# Patient Record
Sex: Female | Born: 1950
Health system: Southern US, Community
[De-identification: ages and names within clinical notes are randomized; demographics above are authoritative.]

## PROBLEM LIST (undated history)

## (undated) ENCOUNTER — Emergency Department: Discharge status: Absent without leave | Payer: Self-pay

## (undated) DIAGNOSIS — E039 Hypothyroidism, unspecified: Secondary | ICD-10-CM

## (undated) DIAGNOSIS — Z85850 Personal history of malignant neoplasm of thyroid: Secondary | ICD-10-CM

## (undated) DIAGNOSIS — L409 Psoriasis, unspecified: Secondary | ICD-10-CM

## (undated) HISTORY — PX: TONSILLECTOMY: SUR1361

## (undated) HISTORY — PX: THYROIDECTOMY, PARTIAL: SHX18

## (undated) HISTORY — PX: APPENDECTOMY: SHX54

---

## 1998-11-09 ENCOUNTER — Other Ambulatory Visit: Admission: RE | Admit: 1998-11-09 | Discharge: 1998-11-09 | Payer: Self-pay | Admitting: Obstetrics & Gynecology

## 2002-10-15 ENCOUNTER — Other Ambulatory Visit: Admission: RE | Admit: 2002-10-15 | Discharge: 2002-10-15 | Payer: Self-pay | Admitting: Obstetrics & Gynecology

## 2003-10-16 ENCOUNTER — Other Ambulatory Visit: Admission: RE | Admit: 2003-10-16 | Discharge: 2003-10-16 | Payer: Self-pay | Admitting: Obstetrics & Gynecology

## 2004-10-20 ENCOUNTER — Other Ambulatory Visit: Admission: RE | Admit: 2004-10-20 | Discharge: 2004-10-20 | Payer: Self-pay | Admitting: Obstetrics & Gynecology

## 2005-10-17 ENCOUNTER — Other Ambulatory Visit: Admission: RE | Admit: 2005-10-17 | Discharge: 2005-10-17 | Payer: Self-pay | Admitting: Obstetrics & Gynecology

## 2010-11-11 ENCOUNTER — Encounter
Admission: RE | Admit: 2010-11-11 | Discharge: 2010-11-11 | Payer: Self-pay | Source: Home / Self Care | Attending: Obstetrics & Gynecology | Admitting: Obstetrics & Gynecology

## 2010-11-30 ENCOUNTER — Encounter
Admission: RE | Admit: 2010-11-30 | Discharge: 2010-11-30 | Payer: Self-pay | Source: Home / Self Care | Attending: Gastroenterology | Admitting: Gastroenterology

## 2016-02-05 DIAGNOSIS — E038 Other specified hypothyroidism: Secondary | ICD-10-CM | POA: Diagnosis not present

## 2016-02-05 DIAGNOSIS — Z0000 Encounter for general adult medical examination without abnormal findings: Secondary | ICD-10-CM | POA: Diagnosis not present

## 2016-02-12 DIAGNOSIS — E038 Other specified hypothyroidism: Secondary | ICD-10-CM | POA: Diagnosis not present

## 2016-02-12 DIAGNOSIS — Z6829 Body mass index (BMI) 29.0-29.9, adult: Secondary | ICD-10-CM | POA: Diagnosis not present

## 2016-02-12 DIAGNOSIS — Z780 Asymptomatic menopausal state: Secondary | ICD-10-CM | POA: Diagnosis not present

## 2016-02-12 DIAGNOSIS — E784 Other hyperlipidemia: Secondary | ICD-10-CM | POA: Diagnosis not present

## 2016-02-12 DIAGNOSIS — Z23 Encounter for immunization: Secondary | ICD-10-CM | POA: Diagnosis not present

## 2016-02-12 DIAGNOSIS — Z0000 Encounter for general adult medical examination without abnormal findings: Secondary | ICD-10-CM | POA: Diagnosis not present

## 2016-02-12 DIAGNOSIS — Z1389 Encounter for screening for other disorder: Secondary | ICD-10-CM | POA: Diagnosis not present

## 2016-02-12 DIAGNOSIS — G4739 Other sleep apnea: Secondary | ICD-10-CM | POA: Diagnosis not present

## 2016-02-12 DIAGNOSIS — N390 Urinary tract infection, site not specified: Secondary | ICD-10-CM | POA: Diagnosis not present

## 2016-02-12 DIAGNOSIS — G4700 Insomnia, unspecified: Secondary | ICD-10-CM | POA: Diagnosis not present

## 2016-02-12 DIAGNOSIS — L409 Psoriasis, unspecified: Secondary | ICD-10-CM | POA: Diagnosis not present

## 2016-02-12 DIAGNOSIS — R739 Hyperglycemia, unspecified: Secondary | ICD-10-CM | POA: Diagnosis not present

## 2017-10-24 DIAGNOSIS — Z5181 Encounter for therapeutic drug level monitoring: Secondary | ICD-10-CM | POA: Diagnosis not present

## 2017-10-24 DIAGNOSIS — L409 Psoriasis, unspecified: Secondary | ICD-10-CM | POA: Diagnosis not present

## 2018-02-17 ENCOUNTER — Emergency Department (HOSPITAL_COMMUNITY): Payer: Medicare Other

## 2018-02-17 ENCOUNTER — Emergency Department (HOSPITAL_COMMUNITY)
Admission: EM | Admit: 2018-02-17 | Discharge: 2018-02-17 | Disposition: A | Discharge status: Home / Self Care | Payer: Medicare Other | Attending: Emergency Medicine | Admitting: Emergency Medicine

## 2018-02-17 ENCOUNTER — Encounter (HOSPITAL_COMMUNITY): Payer: Self-pay | Admitting: Emergency Medicine

## 2018-02-17 ENCOUNTER — Other Ambulatory Visit: Payer: Self-pay

## 2018-02-17 DIAGNOSIS — S62636B Displaced fracture of distal phalanx of right little finger, initial encounter for open fracture: Secondary | ICD-10-CM | POA: Diagnosis not present

## 2018-02-17 DIAGNOSIS — E039 Hypothyroidism, unspecified: Secondary | ICD-10-CM | POA: Diagnosis not present

## 2018-02-17 DIAGNOSIS — S61309A Unspecified open wound of unspecified finger with damage to nail, initial encounter: Secondary | ICD-10-CM | POA: Diagnosis not present

## 2018-02-17 DIAGNOSIS — Z85850 Personal history of malignant neoplasm of thyroid: Secondary | ICD-10-CM | POA: Insufficient documentation

## 2018-02-17 DIAGNOSIS — S62636A Displaced fracture of distal phalanx of right little finger, initial encounter for closed fracture: Secondary | ICD-10-CM | POA: Diagnosis not present

## 2018-02-17 DIAGNOSIS — S62666A Nondisplaced fracture of distal phalanx of right little finger, initial encounter for closed fracture: Secondary | ICD-10-CM | POA: Diagnosis not present

## 2018-02-17 DIAGNOSIS — S61306A Unspecified open wound of right little finger with damage to nail, initial encounter: Secondary | ICD-10-CM | POA: Diagnosis not present

## 2018-02-17 DIAGNOSIS — Y929 Unspecified place or not applicable: Secondary | ICD-10-CM | POA: Insufficient documentation

## 2018-02-17 DIAGNOSIS — Z79899 Other long term (current) drug therapy: Secondary | ICD-10-CM | POA: Insufficient documentation

## 2018-02-17 DIAGNOSIS — Y93K1 Activity, walking an animal: Secondary | ICD-10-CM | POA: Diagnosis not present

## 2018-02-17 DIAGNOSIS — Y998 Other external cause status: Secondary | ICD-10-CM | POA: Diagnosis not present

## 2018-02-17 DIAGNOSIS — W230XXA Caught, crushed, jammed, or pinched between moving objects, initial encounter: Secondary | ICD-10-CM | POA: Diagnosis not present

## 2018-02-17 DIAGNOSIS — S6992XA Unspecified injury of left wrist, hand and finger(s), initial encounter: Secondary | ICD-10-CM | POA: Diagnosis present

## 2018-02-17 HISTORY — DX: Personal history of malignant neoplasm of thyroid: Z85.850

## 2018-02-17 HISTORY — DX: Psoriasis, unspecified: L40.9

## 2018-02-17 HISTORY — DX: Hypothyroidism, unspecified: E03.9

## 2018-02-17 MED ORDER — CEFAZOLIN SODIUM-DEXTROSE 2-4 GM/100ML-% IV SOLN
2.0000 g | Freq: Once | INTRAVENOUS | Status: AC
  Administered 2018-02-17: 2 g via INTRAVENOUS
  Filled 2018-02-17: qty 100

## 2018-02-17 MED ORDER — LIDOCAINE HCL (PF) 1 % IJ SOLN
5.0000 mL | Freq: Once | INTRAMUSCULAR | Status: AC
  Administered 2018-02-17: 5 mL via INTRADERMAL
  Filled 2018-02-17: qty 30

## 2018-02-17 MED ORDER — TRAMADOL HCL 50 MG PO TABS: 50.0000 mg | ORAL_TABLET | Freq: Four times a day (QID) | ORAL | 0 refills | Status: DC | PRN

## 2018-02-17 MED ORDER — LIDOCAINE HCL (PF) 1 % IJ SOLN
5.0000 mL | Freq: Once | INTRAMUSCULAR | Status: DC
  Filled 2018-02-17: qty 30

## 2018-02-17 MED ORDER — CEPHALEXIN 500 MG PO CAPS: ORAL_CAPSULE | ORAL | 0 refills | Status: DC

## 2018-02-17 NOTE — Discharge Instructions (Signed)
Contact a health care provider if: Your pain or swelling gets worse even with treatment. You have trouble moving your finger. Get help right away if: Your finger becomes numb or blue.

## 2018-02-17 NOTE — ED Provider Notes (Signed)
[facility] DEPT Provider Note   CSN: [id number] Arrival date & time: 02/17/18  1731     History   Chief Complaint Chief Complaint  Patient presents with  . Finger Injury    HPI Sonya Steele is a 67 y.o. female who presents the emergency department chief complaint of finger injury.  Patient was walking her dog when she was pulled forward, fell and hit her right little finger.  She avulsed the nail and had severe pain.  She is up-to-date on her tetanus vaccination.  She denies previous injury to the area.  It occurred about 3 hours prior to arrival to my evaluation in the emergency department  HPI  Past Medical History:  Diagnosis Date  . History of thyroid cancer   . Hypothyroidism   . Psoriasis     There are no active problems to display for this patient.   Past Surgical History:  Procedure Laterality Date  . APPENDECTOMY    . CESAREAN SECTION    . THYROIDECTOMY, PARTIAL    . TONSILLECTOMY      OB History    No data available       Home Medications    Prior to Admission medications   Medication Sig Start Date End Date Taking? Authorizing Provider  cholecalciferol (VITAMIN D) 1000 units tablet Take 1,000 Units by mouth daily.   Yes [provider]  levothyroxine (SYNTHROID, LEVOTHROID) 88 MCG tablet Take 88 mcg by mouth daily before breakfast.   Yes [provider]  Multiple Vitamin (MULTIVITAMIN WITH MINERALS) TABS tablet Take 1 tablet by mouth daily.   Yes [provider]  naproxen sodium (ALEVE) 220 MG tablet Take 220 mg by mouth 2 (two) times daily as needed (pain).   Yes [provider]  Secukinumab (COSENTYX 300 DOSE [place]) Inject 300 mg into the skin every 30 (thirty) days.   Yes [provider]  cephALEXin (KEFLEX) 500 MG capsule 2 caps po bid x 7 days 02/17/18   Margarita Mail, PA-C  traMADol (ULTRAM) 50 MG tablet Take 1 tablet (50 mg total) by mouth every 6 (six) hours as  needed. 02/17/18   Margarita Mail, PA-C    Family History History reviewed. No pertinent family history.  Social History Social History   Tobacco Use  . Smoking status: Never Smoker  . Smokeless tobacco: Never Used  Substance Use Topics  . Alcohol use: Yes  . Drug use: No     Allergies   Patient has no known allergies.   Review of Systems Review of Systems  Ten systems reviewed and are negative for acute change, except as noted in the HPI.   Physical Exam Updated Vital Signs BP (!) 168/98   Pulse (!) 55   Temp 98.8 F (37.1 C) (Oral)   Resp 16   Wt 84.8 kg (187 lb)   SpO2 97%   Physical Exam  Constitutional: She is oriented to person, place, and time. She appears well-developed and well-nourished. No distress.  HENT:  Head: Normocephalic and atraumatic.  Eyes: Conjunctivae are normal. No scleral icterus.  Neck: Normal range of motion.  Cardiovascular: Normal rate, regular rhythm and normal heart sounds. Exam reveals no gallop and no friction rub.  No murmur heard. Pulmonary/Chest: Effort normal and breath sounds normal. No respiratory distress.  Abdominal: Soft. Bowel sounds are normal. She exhibits no distension and no mass. There is no tenderness. There is no guarding.  Musculoskeletal:  Patient with right finger nail  avulsion.  There is visible and palpable distal phalanx within the avulsed nail bed.  Neurological: She is alert and oriented to person, place, and time.  Skin: Skin is warm and dry. She is not diaphoretic.  Psychiatric: Her behavior is normal.  Nursing note and vitals reviewed.    ED Treatments / Results  Labs (all labs ordered are listed, but only abnormal results are displayed) Labs Reviewed - No data to display  EKG  EKG Interpretation None       Radiology Dg Finger Little Right  Result Date: 02/17/2018 CLINICAL DATA:  Slip on mud and fall while walking dog. Right little finger pain and bleeding. EXAM: RIGHT LITTLE FINGER 2+V  COMPARISON:  None. FINDINGS: Minimally comminuted nondisplaced distal tuft fracture of the little finger. No intra-articular extension. No additional fracture. The alignment is maintained. Minimal osteoarthritis. Overlying dressing in place, no radiopaque foreign body. IMPRESSION: Minimally comminuted nondisplaced distal tuft fracture of the little finger. Electronically Signed   By: Jeb Levering M.D.   On: 02/17/2018 21:01    Procedures .Nail Removal  Date/Time: 02/17/2018 11:56 PM  Performed by: Margarita Mail, PA-C  Authorized by: Margarita Mail, PA-C   Consent:    Consent obtained:  Verbal   Consent given by:  Patient Location:    Hand:  R small finger Pre-procedure details:    Skin preparation:  Betadine Anesthesia (see MAR for exact dosages):    Anesthesia method:  Nerve block   Block needle gauge:  27 G   Block anesthetic:  Lidocaine 1% w/o epi   Block technique:  Mcp   Block injection procedure:  Anatomic landmarks identified, anatomic landmarks palpated, incremental injection and negative aspiration for blood   Block outcome:  Anesthesia achieved Post-procedure details:    Dressing:  Petrolatum-impregnated gauze and splint   Patient tolerance of procedure:  Tolerated well, no immediate complications Comments:     Patient with open distal phalanx fracture of the right small finger.  She had complete nail avulsion prior to arrival with avulsion of the tissue of the nailbed and exposed bone.  After MCP block the area was debrided of foreign bodies and tissue, cleansed thoroughly and irrigated.  I then cut a piece of sterile foil to place in the nail matrix and sewed the foil in using 4-0 Vicryl stitches.  There were 4 sutures placed.  The nailbed was then covered in Xeroform gauze, wrapped in Kerlix and placed in a finger splint by the orthopedic tech.   (including critical care time) SPLINT APPLICATION Date/Time: 78:46 AM Authorized by: Margarita Mail Consent: Verbal  consent obtained. Risks and benefits: risks, benefits and alternatives were discussed Consent given by: patient Splint applied by: orthopedic technician Location details: left finger Splint type: finger Post-procedure: The splinted body part was neurovascularly unchanged following the procedure. Patient tolerance: Patient tolerated the procedure well with no immediate complications.    Medications Ordered in ED Medications  lidocaine (PF) (XYLOCAINE) 1 % injection 5 mL (5 mLs Intradermal Not Given 02/17/18 2220)  lidocaine (PF) (XYLOCAINE) 1 % injection 5 mL (5 mLs Intradermal Given 02/17/18 2219)  ceFAZolin (ANCEF) IVPB 2g/100 mL premix (0 g Intravenous Stopped 02/17/18 2329)     Initial Impression / Assessment and Plan / ED Course  I have reviewed the triage vital signs and the nursing notes.  Pertinent labs & imaging results that were available during my care of the patient were reviewed by me and considered in my medical decision making (see chart  for details).       Pt seen in shared visit with Dr. Leonette Monarch.  Patient given 2 g IV ancef. D/c with keflex and close follow up. Final Clinical Impressions(s) / ED Diagnoses   Final diagnoses:  Open displaced fracture of distal phalanx of right little finger, initial encounter  Nail avulsion, finger, initial encounter    ED Discharge Orders        Ordered    cephALEXin (KEFLEX) 500 MG capsule     02/17/18 2321    traMADol (ULTRAM) 50 MG tablet  Every 6 hours PRN     02/17/18 2321      Margarita Mail, PA-C 02/18/18 0011  Fatima Blank, MD 02/18/18 [phone]

## 2018-02-17 NOTE — ED Triage Notes (Signed)
Pt comes in s/p fall. Patient was walking her dog and slipped in mud. Patient only complaint is of a missing fingernail on her fifth digit. Bleeding controlled, but finger nail is missing.

## 2018-02-18 ENCOUNTER — Encounter (HOSPITAL_COMMUNITY): Payer: Self-pay | Admitting: Emergency Medicine

## 2018-02-19 DIAGNOSIS — S67196A Crushing injury of right little finger, initial encounter: Secondary | ICD-10-CM | POA: Diagnosis not present

## 2018-02-26 DIAGNOSIS — S67196D Crushing injury of right little finger, subsequent encounter: Secondary | ICD-10-CM | POA: Diagnosis not present

## 2018-03-26 DIAGNOSIS — E038 Other specified hypothyroidism: Secondary | ICD-10-CM | POA: Diagnosis not present

## 2018-03-26 DIAGNOSIS — R7309 Other abnormal glucose: Secondary | ICD-10-CM | POA: Diagnosis not present

## 2018-03-26 DIAGNOSIS — E7849 Other hyperlipidemia: Secondary | ICD-10-CM | POA: Diagnosis not present

## 2018-04-02 DIAGNOSIS — E7849 Other hyperlipidemia: Secondary | ICD-10-CM | POA: Diagnosis not present

## 2018-04-02 DIAGNOSIS — R740 Nonspecific elevation of levels of transaminase and lactic acid dehydrogenase [LDH]: Secondary | ICD-10-CM | POA: Diagnosis not present

## 2018-04-02 DIAGNOSIS — G4709 Other insomnia: Secondary | ICD-10-CM | POA: Diagnosis not present

## 2018-04-02 DIAGNOSIS — Z79899 Other long term (current) drug therapy: Secondary | ICD-10-CM | POA: Diagnosis not present

## 2018-04-02 DIAGNOSIS — L408 Other psoriasis: Secondary | ICD-10-CM | POA: Diagnosis not present

## 2018-04-02 DIAGNOSIS — Z6831 Body mass index (BMI) 31.0-31.9, adult: Secondary | ICD-10-CM | POA: Diagnosis not present

## 2018-04-02 DIAGNOSIS — C73 Malignant neoplasm of thyroid gland: Secondary | ICD-10-CM | POA: Diagnosis not present

## 2018-04-02 DIAGNOSIS — R82998 Other abnormal findings in urine: Secondary | ICD-10-CM | POA: Diagnosis not present

## 2018-04-02 DIAGNOSIS — Z1389 Encounter for screening for other disorder: Secondary | ICD-10-CM | POA: Diagnosis not present

## 2018-04-02 DIAGNOSIS — E1165 Type 2 diabetes mellitus with hyperglycemia: Secondary | ICD-10-CM | POA: Diagnosis not present

## 2018-04-02 DIAGNOSIS — E038 Other specified hypothyroidism: Secondary | ICD-10-CM | POA: Diagnosis not present

## 2018-04-02 DIAGNOSIS — Z0000 Encounter for general adult medical examination without abnormal findings: Secondary | ICD-10-CM | POA: Diagnosis not present

## 2018-04-02 DIAGNOSIS — Z23 Encounter for immunization: Secondary | ICD-10-CM | POA: Diagnosis not present

## 2018-04-02 DIAGNOSIS — M1990 Unspecified osteoarthritis, unspecified site: Secondary | ICD-10-CM | POA: Diagnosis not present

## 2018-04-05 DIAGNOSIS — W0110XA Fall on same level from slipping, tripping and stumbling with subsequent striking against unspecified object, initial encounter: Secondary | ICD-10-CM | POA: Diagnosis not present

## 2018-04-05 DIAGNOSIS — S025XXA Fracture of tooth (traumatic), initial encounter for closed fracture: Secondary | ICD-10-CM | POA: Diagnosis not present

## 2018-04-12 DIAGNOSIS — Z6830 Body mass index (BMI) 30.0-30.9, adult: Secondary | ICD-10-CM | POA: Diagnosis not present

## 2018-04-12 DIAGNOSIS — E1165 Type 2 diabetes mellitus with hyperglycemia: Secondary | ICD-10-CM | POA: Diagnosis not present

## 2018-04-19 DIAGNOSIS — Z1212 Encounter for screening for malignant neoplasm of rectum: Secondary | ICD-10-CM | POA: Diagnosis not present

## 2018-05-03 DIAGNOSIS — L409 Psoriasis, unspecified: Secondary | ICD-10-CM | POA: Diagnosis not present

## 2018-05-03 DIAGNOSIS — Z79899 Other long term (current) drug therapy: Secondary | ICD-10-CM | POA: Diagnosis not present

## 2018-05-09 DIAGNOSIS — R740 Nonspecific elevation of levels of transaminase and lactic acid dehydrogenase [LDH]: Secondary | ICD-10-CM | POA: Diagnosis not present

## 2018-07-02 DIAGNOSIS — Z6830 Body mass index (BMI) 30.0-30.9, adult: Secondary | ICD-10-CM | POA: Diagnosis not present

## 2018-07-02 DIAGNOSIS — Z9089 Acquired absence of other organs: Secondary | ICD-10-CM | POA: Diagnosis not present

## 2018-07-02 DIAGNOSIS — E038 Other specified hypothyroidism: Secondary | ICD-10-CM | POA: Diagnosis not present

## 2018-07-02 DIAGNOSIS — E1169 Type 2 diabetes mellitus with other specified complication: Secondary | ICD-10-CM | POA: Diagnosis not present

## 2018-07-02 DIAGNOSIS — R740 Nonspecific elevation of levels of transaminase and lactic acid dehydrogenase [LDH]: Secondary | ICD-10-CM | POA: Diagnosis not present

## 2018-07-04 ENCOUNTER — Other Ambulatory Visit: Payer: Self-pay | Admitting: Internal Medicine

## 2018-07-04 DIAGNOSIS — Z9089 Acquired absence of other organs: Secondary | ICD-10-CM

## 2018-07-04 DIAGNOSIS — E890 Postprocedural hypothyroidism: Secondary | ICD-10-CM

## 2018-07-13 ENCOUNTER — Ambulatory Visit
Admission: RE | Admit: 2018-07-13 | Discharge: 2018-07-13 | Disposition: A | Discharge status: Home / Self Care | Payer: Medicare Other | Source: Ambulatory Visit | Attending: Internal Medicine | Admitting: Internal Medicine

## 2018-07-13 ENCOUNTER — Other Ambulatory Visit: Payer: Self-pay | Admitting: Internal Medicine

## 2018-07-13 DIAGNOSIS — E039 Hypothyroidism, unspecified: Secondary | ICD-10-CM | POA: Diagnosis not present

## 2018-07-13 DIAGNOSIS — E890 Postprocedural hypothyroidism: Secondary | ICD-10-CM

## 2018-07-13 DIAGNOSIS — Z9009 Acquired absence of other part of head and neck: Secondary | ICD-10-CM

## 2018-07-13 DIAGNOSIS — Z9089 Acquired absence of other organs: Secondary | ICD-10-CM

## 2018-08-24 DIAGNOSIS — Z6831 Body mass index (BMI) 31.0-31.9, adult: Secondary | ICD-10-CM | POA: Diagnosis not present

## 2018-08-24 DIAGNOSIS — R600 Localized edema: Secondary | ICD-10-CM | POA: Diagnosis not present

## 2018-08-31 ENCOUNTER — Encounter (INDEPENDENT_AMBULATORY_CARE_PROVIDER_SITE_OTHER): Payer: Self-pay | Admitting: Orthopaedic Surgery

## 2018-08-31 ENCOUNTER — Ambulatory Visit (INDEPENDENT_AMBULATORY_CARE_PROVIDER_SITE_OTHER): Payer: Medicare Other | Admitting: Orthopaedic Surgery

## 2018-08-31 ENCOUNTER — Ambulatory Visit (INDEPENDENT_AMBULATORY_CARE_PROVIDER_SITE_OTHER): Payer: Medicare Other

## 2018-08-31 DIAGNOSIS — M660 Rupture of popliteal cyst: Secondary | ICD-10-CM | POA: Diagnosis not present

## 2018-08-31 NOTE — Progress Notes (Signed)
Office Visit Note   Patient: Sonya Steele           Date of Birth: 1951-05-13           MRN: [id number] Visit Date: 08/31/2018              Requested by: Velna Hatchet, MD [address] [place], [place] [address] PCP: Velna Hatchet, MD   Assessment & Plan: Visit Diagnoses:  1. Ruptured Bakers cyst     Plan: Impression is ruptured Baker's cyst left lower extremity likely from reactive synovitis versus degenerative medial meniscus tear.  At this point, the patient is fairly asymptomatic.  I have encouraged her to pick up a pair of compression socks to wear in the meantime.  She will take an over-the-counter anti-inflammatory as needed.  Follow-up with [place] as needed.  Call if concerns or questions.  Follow-Up Instructions: Return if symptoms worsen or fail to improve.   Orders:  Orders Placed This Encounter  Procedures  . XR Knee Complete 4 Views Left   No orders of the defined types were placed in this encounter.     Procedures: No procedures performed   Clinical Data: No additional findings.   Subjective: Chief Complaint  Patient presents with  . Left Leg - Pain, Edema    HPI patient is a pleasant 67 year old female who presents to our clinic today following results of a ruptured Baker's cyst to the left lower extremity.  2 weeks ago, she noticed increased pain and swelling to the left calf.  A few days later she had a Doppler ultrasound which revealed a ruptured Baker's cyst.  She denies any previous left knee pain.  No left knee injury, however she does note that she started Pilates and did a fair amount of yard work about a month leading up to this incident.  She has recently started to notice some "clicking "to the left knee.  More of a soreness to the left lower extremity at this point.  This does seem to be worse with activity.  Elevation seems to make this better.  Of note, she does have a history of psoriasis.  No previous cortisone injections or surgical  intervention to the left knee.  Review of Systems as detailed in HPI.  All others reviewed and are negative.   Objective: Vital Signs: There were no vitals taken for this visit.  Physical Exam well-developed well-nourished female in no acute distress.  Alert and oriented x3.  Ortho Exam examination of left lower extremity reveals mild to moderate swelling.  Calf is soft and nontender.  No effusion to the knee.  Range of motion 0 to 120 degrees.  Moderate tenderness medial joint line.  1+ patellofemoral crepitus.  She is neurovascularly intact distally.  Specialty Comments:  No specialty comments available.  Imaging: Xr Knee Complete 4 Views Left  Result Date: 08/31/2018 Moderate joint space narrowing medial and patellofemoral compartments    PMFS History: There are no active problems to display for this patient.  Past Medical History:  Diagnosis Date  . History of thyroid cancer   . Hypothyroidism   . Psoriasis     History reviewed. No pertinent family history.  Past Surgical History:  Procedure Laterality Date  . APPENDECTOMY    . CESAREAN SECTION    . THYROIDECTOMY, PARTIAL    . TONSILLECTOMY     Social History   Occupational History  . Not on file  Tobacco Use  . Smoking status: Never Smoker  .  Smokeless tobacco: Never Used  Substance and Sexual Activity  . Alcohol use: Yes  . Drug use: No  . Sexual activity: Not Currently

## 2018-09-10 DIAGNOSIS — D2272 Melanocytic nevi of left lower limb, including hip: Secondary | ICD-10-CM | POA: Diagnosis not present

## 2018-09-10 DIAGNOSIS — Z5181 Encounter for therapeutic drug level monitoring: Secondary | ICD-10-CM | POA: Diagnosis not present

## 2018-09-10 DIAGNOSIS — Z23 Encounter for immunization: Secondary | ICD-10-CM | POA: Diagnosis not present

## 2018-09-10 DIAGNOSIS — L409 Psoriasis, unspecified: Secondary | ICD-10-CM | POA: Diagnosis not present

## 2018-09-13 ENCOUNTER — Encounter (INDEPENDENT_AMBULATORY_CARE_PROVIDER_SITE_OTHER): Payer: Self-pay | Admitting: Orthopaedic Surgery

## 2018-09-13 ENCOUNTER — Ambulatory Visit (INDEPENDENT_AMBULATORY_CARE_PROVIDER_SITE_OTHER): Payer: Medicare Other | Admitting: Orthopaedic Surgery

## 2018-09-13 DIAGNOSIS — M25562 Pain in left knee: Secondary | ICD-10-CM | POA: Diagnosis not present

## 2018-09-13 DIAGNOSIS — M660 Rupture of popliteal cyst: Secondary | ICD-10-CM

## 2018-09-13 DIAGNOSIS — G8929 Other chronic pain: Secondary | ICD-10-CM | POA: Diagnosis not present

## 2018-09-13 MED ORDER — LIDOCAINE HCL 1 % IJ SOLN
2.0000 mL | INTRAMUSCULAR | Status: AC | PRN
  Administered 2018-09-13: 2 mL

## 2018-09-13 MED ORDER — BUPIVACAINE HCL 0.5 % IJ SOLN
2.0000 mL | INTRAMUSCULAR | Status: AC | PRN
  Administered 2018-09-13: 2 mL via INTRA_ARTICULAR

## 2018-09-13 MED ORDER — METHYLPREDNISOLONE ACETATE 40 MG/ML IJ SUSP
40.0000 mg | INTRAMUSCULAR | Status: AC | PRN
Start: 2018-09-13 — End: 2018-09-13
  Administered 2018-09-13: 40 mg via INTRA_ARTICULAR

## 2018-09-13 NOTE — Progress Notes (Signed)
   Office Visit Note   Patient: Sonya Steele           Date of Birth: 1951-11-17           MRN: [id number] Visit Date: 09/13/2018              Requested by: Velna Hatchet, MD [address] [place], [place] [address] PCP: Velna Hatchet, MD   Assessment & Plan: Visit Diagnoses:  1. Ruptured Bakers cyst   2. Chronic pain of left knee     Plan: Impression is degenerative medial meniscus tear versus degenerative joint disease.  We discussed MRI versus cortisone injection and the patient elects to proceed with a cortisone injection first.  She will let us know over the next couple weeks if she does not get any improvement at which point we will get an MRI for a medial meniscal tear.  Patient tolerated the injection well today.   Follow-Up Instructions: Return if symptoms worsen or fail to improve.   Orders:  No orders of the defined types were placed in this encounter.  No orders of the defined types were placed in this encounter.     Procedures: Large Joint Inj: L knee on 09/13/2018 9:30 AM Details: 22 G needle Medications: 2 mL bupivacaine 0.5 %; 2 mL lidocaine 1 %; 40 mg methylPREDNISolone acetate 40 MG/ML Outcome: tolerated well, no immediate complications Patient was prepped and draped in the usual sterile fashion.       Clinical Data: No additional findings.   Subjective: Chief Complaint  Patient presents with  . Left Knee - Pain    Patient follows up today for left knee pain.  She has chronic medial sided knee pain that wakes her up at night.  She feels like the pain is getting worse not better.  She previously had a ruptured Baker's cyst.  She states that her knee feels like it wants to give out.   Review of Systems   Objective: Vital Signs: There were no vitals taken for this visit.  Physical Exam  Ortho Exam Left knee exam shows no joint effusion.  Collaterals and cruciates are stable.  Medial joint line tenderness. Specialty Comments:  No  specialty comments available.  Imaging: No results found.   PMFS History: There are no active problems to display for this patient.  Past Medical History:  Diagnosis Date  . History of thyroid cancer   . Hypothyroidism   . Psoriasis     History reviewed. No pertinent family history.  Past Surgical History:  Procedure Laterality Date  . APPENDECTOMY    . CESAREAN SECTION    . THYROIDECTOMY, PARTIAL    . TONSILLECTOMY     Social History   Occupational History  . Not on file  Tobacco Use  . Smoking status: Never Smoker  . Smokeless tobacco: Never Used  Substance and Sexual Activity  . Alcohol use: Yes  . Drug use: No  . Sexual activity: Not Currently

## 2018-09-17 ENCOUNTER — Telehealth (INDEPENDENT_AMBULATORY_CARE_PROVIDER_SITE_OTHER): Payer: Self-pay | Admitting: Orthopaedic Surgery

## 2018-09-17 ENCOUNTER — Other Ambulatory Visit (INDEPENDENT_AMBULATORY_CARE_PROVIDER_SITE_OTHER): Payer: Self-pay

## 2018-09-17 DIAGNOSIS — M25562 Pain in left knee: Principal | ICD-10-CM

## 2018-09-17 DIAGNOSIS — G8929 Other chronic pain: Secondary | ICD-10-CM

## 2018-09-17 NOTE — Telephone Encounter (Signed)
Ok.  MRI r/o structural abnormalities.

## 2018-09-17 NOTE — Telephone Encounter (Signed)
Patient called asked if she can be set up for an MRI. The number to contact patient is [phone]

## 2018-09-17 NOTE — Telephone Encounter (Signed)
ORDER DONE. SOMEONE WILL CALL HER TO SET UP MRI APPT

## 2018-09-17 NOTE — Telephone Encounter (Signed)
See message below °

## 2018-09-24 ENCOUNTER — Ambulatory Visit
Admission: RE | Admit: 2018-09-24 | Discharge: 2018-09-24 | Disposition: A | Discharge status: Home / Self Care | Payer: Medicare Other | Source: Ambulatory Visit | Attending: Orthopaedic Surgery | Admitting: Orthopaedic Surgery

## 2018-09-24 DIAGNOSIS — M25562 Pain in left knee: Principal | ICD-10-CM

## 2018-09-24 DIAGNOSIS — M1712 Unilateral primary osteoarthritis, left knee: Secondary | ICD-10-CM | POA: Diagnosis not present

## 2018-09-24 DIAGNOSIS — G8929 Other chronic pain: Secondary | ICD-10-CM

## 2018-09-24 NOTE — Progress Notes (Signed)
She will call this week to schedule surgery.  She's looking at the Friday before thanksgiving.

## 2018-10-02 DIAGNOSIS — M25562 Pain in left knee: Secondary | ICD-10-CM | POA: Diagnosis not present

## 2018-10-15 DIAGNOSIS — M25562 Pain in left knee: Secondary | ICD-10-CM | POA: Diagnosis not present

## 2018-10-25 DIAGNOSIS — Z5181 Encounter for therapeutic drug level monitoring: Secondary | ICD-10-CM | POA: Diagnosis not present

## 2018-10-25 DIAGNOSIS — Z23 Encounter for immunization: Secondary | ICD-10-CM | POA: Diagnosis not present

## 2018-10-25 DIAGNOSIS — L409 Psoriasis, unspecified: Secondary | ICD-10-CM | POA: Diagnosis not present

## 2018-10-26 DIAGNOSIS — E668 Other obesity: Secondary | ICD-10-CM | POA: Diagnosis not present

## 2018-10-26 DIAGNOSIS — Z6832 Body mass index (BMI) 32.0-32.9, adult: Secondary | ICD-10-CM | POA: Diagnosis not present

## 2018-10-26 DIAGNOSIS — L408 Other psoriasis: Secondary | ICD-10-CM | POA: Diagnosis not present

## 2018-10-26 DIAGNOSIS — E1169 Type 2 diabetes mellitus with other specified complication: Secondary | ICD-10-CM | POA: Diagnosis not present

## 2018-10-26 DIAGNOSIS — R740 Nonspecific elevation of levels of transaminase and lactic acid dehydrogenase [LDH]: Secondary | ICD-10-CM | POA: Diagnosis not present

## 2018-10-26 DIAGNOSIS — Z79899 Other long term (current) drug therapy: Secondary | ICD-10-CM | POA: Diagnosis not present

## 2018-10-26 DIAGNOSIS — M25562 Pain in left knee: Secondary | ICD-10-CM | POA: Diagnosis not present

## 2018-10-26 DIAGNOSIS — E038 Other specified hypothyroidism: Secondary | ICD-10-CM | POA: Diagnosis not present

## 2018-11-16 DIAGNOSIS — G4733 Obstructive sleep apnea (adult) (pediatric): Secondary | ICD-10-CM | POA: Diagnosis not present

## 2018-11-16 DIAGNOSIS — Z85850 Personal history of malignant neoplasm of thyroid: Secondary | ICD-10-CM | POA: Diagnosis not present

## 2018-11-16 DIAGNOSIS — K7581 Nonalcoholic steatohepatitis (NASH): Secondary | ICD-10-CM | POA: Diagnosis not present

## 2018-11-16 DIAGNOSIS — M2242 Chondromalacia patellae, left knee: Secondary | ICD-10-CM | POA: Diagnosis not present

## 2018-11-16 DIAGNOSIS — S83242A Other tear of medial meniscus, current injury, left knee, initial encounter: Secondary | ICD-10-CM | POA: Diagnosis not present

## 2018-11-16 DIAGNOSIS — Z79899 Other long term (current) drug therapy: Secondary | ICD-10-CM | POA: Diagnosis not present

## 2018-11-16 DIAGNOSIS — G8918 Other acute postprocedural pain: Secondary | ICD-10-CM | POA: Diagnosis not present

## 2018-11-16 DIAGNOSIS — S83232A Complex tear of medial meniscus, current injury, left knee, initial encounter: Secondary | ICD-10-CM | POA: Diagnosis not present

## 2019-03-11 DIAGNOSIS — L409 Psoriasis, unspecified: Secondary | ICD-10-CM | POA: Diagnosis not present

## 2019-03-11 DIAGNOSIS — Z5181 Encounter for therapeutic drug level monitoring: Secondary | ICD-10-CM | POA: Diagnosis not present

## 2019-03-28 DIAGNOSIS — E7849 Other hyperlipidemia: Secondary | ICD-10-CM | POA: Diagnosis not present

## 2019-03-28 DIAGNOSIS — E1169 Type 2 diabetes mellitus with other specified complication: Secondary | ICD-10-CM | POA: Diagnosis not present

## 2019-03-28 DIAGNOSIS — E038 Other specified hypothyroidism: Secondary | ICD-10-CM | POA: Diagnosis not present

## 2019-04-04 DIAGNOSIS — E785 Hyperlipidemia, unspecified: Secondary | ICD-10-CM | POA: Diagnosis not present

## 2019-04-04 DIAGNOSIS — E039 Hypothyroidism, unspecified: Secondary | ICD-10-CM | POA: Diagnosis not present

## 2019-04-04 DIAGNOSIS — R740 Nonspecific elevation of levels of transaminase and lactic acid dehydrogenase [LDH]: Secondary | ICD-10-CM | POA: Diagnosis not present

## 2019-04-04 DIAGNOSIS — E669 Obesity, unspecified: Secondary | ICD-10-CM | POA: Diagnosis not present

## 2019-04-04 DIAGNOSIS — E1169 Type 2 diabetes mellitus with other specified complication: Secondary | ICD-10-CM | POA: Diagnosis not present

## 2019-04-04 DIAGNOSIS — M25562 Pain in left knee: Secondary | ICD-10-CM | POA: Diagnosis not present

## 2019-04-04 DIAGNOSIS — L409 Psoriasis, unspecified: Secondary | ICD-10-CM | POA: Diagnosis not present

## 2019-04-04 DIAGNOSIS — Z0000 Encounter for general adult medical examination without abnormal findings: Secondary | ICD-10-CM | POA: Diagnosis not present

## 2019-06-21 DIAGNOSIS — Z20828 Contact with and (suspected) exposure to other viral communicable diseases: Secondary | ICD-10-CM | POA: Diagnosis not present

## 2019-07-01 DIAGNOSIS — M25561 Pain in right knee: Secondary | ICD-10-CM | POA: Diagnosis not present

## 2019-07-08 DIAGNOSIS — M25561 Pain in right knee: Secondary | ICD-10-CM | POA: Diagnosis not present

## 2019-07-08 DIAGNOSIS — M25461 Effusion, right knee: Secondary | ICD-10-CM | POA: Diagnosis not present

## 2019-07-08 DIAGNOSIS — R600 Localized edema: Secondary | ICD-10-CM | POA: Diagnosis not present

## 2019-07-08 DIAGNOSIS — M2241 Chondromalacia patellae, right knee: Secondary | ICD-10-CM | POA: Diagnosis not present

## 2019-07-08 DIAGNOSIS — M7121 Synovial cyst of popliteal space [Baker], right knee: Secondary | ICD-10-CM | POA: Diagnosis not present

## 2019-08-24 DIAGNOSIS — Z23 Encounter for immunization: Secondary | ICD-10-CM | POA: Diagnosis not present

## 2019-09-27 DIAGNOSIS — Z79899 Other long term (current) drug therapy: Secondary | ICD-10-CM | POA: Diagnosis not present

## 2019-09-27 DIAGNOSIS — Z23 Encounter for immunization: Secondary | ICD-10-CM | POA: Diagnosis not present

## 2019-09-27 DIAGNOSIS — L409 Psoriasis, unspecified: Secondary | ICD-10-CM | POA: Diagnosis not present

## 2019-10-04 DIAGNOSIS — E669 Obesity, unspecified: Secondary | ICD-10-CM | POA: Diagnosis not present

## 2019-10-04 DIAGNOSIS — R600 Localized edema: Secondary | ICD-10-CM | POA: Diagnosis not present

## 2019-10-04 DIAGNOSIS — Z79899 Other long term (current) drug therapy: Secondary | ICD-10-CM | POA: Diagnosis not present

## 2019-10-04 DIAGNOSIS — R7401 Elevation of levels of liver transaminase levels: Secondary | ICD-10-CM | POA: Diagnosis not present

## 2019-10-04 DIAGNOSIS — L409 Psoriasis, unspecified: Secondary | ICD-10-CM | POA: Diagnosis not present

## 2019-10-04 DIAGNOSIS — E1169 Type 2 diabetes mellitus with other specified complication: Secondary | ICD-10-CM | POA: Diagnosis not present

## 2019-10-04 DIAGNOSIS — E039 Hypothyroidism, unspecified: Secondary | ICD-10-CM | POA: Diagnosis not present

## 2019-10-07 DIAGNOSIS — E038 Other specified hypothyroidism: Secondary | ICD-10-CM | POA: Diagnosis not present

## 2019-10-07 DIAGNOSIS — E7849 Other hyperlipidemia: Secondary | ICD-10-CM | POA: Diagnosis not present

## 2019-10-07 DIAGNOSIS — E1169 Type 2 diabetes mellitus with other specified complication: Secondary | ICD-10-CM | POA: Diagnosis not present

## 2020-01-03 DIAGNOSIS — L409 Psoriasis, unspecified: Secondary | ICD-10-CM | POA: Diagnosis not present

## 2020-02-10 DIAGNOSIS — M549 Dorsalgia, unspecified: Secondary | ICD-10-CM | POA: Diagnosis not present

## 2020-02-21 DIAGNOSIS — M5126 Other intervertebral disc displacement, lumbar region: Secondary | ICD-10-CM | POA: Diagnosis not present

## 2020-02-21 DIAGNOSIS — M47817 Spondylosis without myelopathy or radiculopathy, lumbosacral region: Secondary | ICD-10-CM | POA: Diagnosis not present

## 2020-02-21 DIAGNOSIS — M549 Dorsalgia, unspecified: Secondary | ICD-10-CM | POA: Diagnosis not present

## 2020-02-21 DIAGNOSIS — M5136 Other intervertebral disc degeneration, lumbar region: Secondary | ICD-10-CM | POA: Diagnosis not present

## 2020-02-21 DIAGNOSIS — M5137 Other intervertebral disc degeneration, lumbosacral region: Secondary | ICD-10-CM | POA: Diagnosis not present

## 2020-03-13 DIAGNOSIS — M5416 Radiculopathy, lumbar region: Secondary | ICD-10-CM | POA: Diagnosis not present

## 2020-03-27 DIAGNOSIS — M5416 Radiculopathy, lumbar region: Secondary | ICD-10-CM | POA: Diagnosis not present

## 2020-03-27 DIAGNOSIS — M549 Dorsalgia, unspecified: Secondary | ICD-10-CM | POA: Diagnosis not present

## 2020-03-27 DIAGNOSIS — M47816 Spondylosis without myelopathy or radiculopathy, lumbar region: Secondary | ICD-10-CM | POA: Diagnosis not present

## 2020-04-01 DIAGNOSIS — Z79899 Other long term (current) drug therapy: Secondary | ICD-10-CM | POA: Diagnosis not present

## 2020-04-01 DIAGNOSIS — L409 Psoriasis, unspecified: Secondary | ICD-10-CM | POA: Diagnosis not present

## 2020-04-01 DIAGNOSIS — D2272 Melanocytic nevi of left lower limb, including hip: Secondary | ICD-10-CM | POA: Diagnosis not present

## 2020-04-01 DIAGNOSIS — L089 Local infection of the skin and subcutaneous tissue, unspecified: Secondary | ICD-10-CM | POA: Diagnosis not present

## 2020-04-01 DIAGNOSIS — L821 Other seborrheic keratosis: Secondary | ICD-10-CM | POA: Diagnosis not present

## 2020-04-01 DIAGNOSIS — L578 Other skin changes due to chronic exposure to nonionizing radiation: Secondary | ICD-10-CM | POA: Diagnosis not present

## 2020-04-01 DIAGNOSIS — D2239 Melanocytic nevi of other parts of face: Secondary | ICD-10-CM | POA: Diagnosis not present

## 2020-04-03 DIAGNOSIS — E7849 Other hyperlipidemia: Secondary | ICD-10-CM | POA: Diagnosis not present

## 2020-04-03 DIAGNOSIS — E038 Other specified hypothyroidism: Secondary | ICD-10-CM | POA: Diagnosis not present

## 2020-04-03 DIAGNOSIS — Z780 Asymptomatic menopausal state: Secondary | ICD-10-CM | POA: Diagnosis not present

## 2020-04-03 DIAGNOSIS — E1169 Type 2 diabetes mellitus with other specified complication: Secondary | ICD-10-CM | POA: Diagnosis not present

## 2020-04-10 DIAGNOSIS — E1165 Type 2 diabetes mellitus with hyperglycemia: Secondary | ICD-10-CM | POA: Diagnosis not present

## 2020-04-10 DIAGNOSIS — E785 Hyperlipidemia, unspecified: Secondary | ICD-10-CM | POA: Diagnosis not present

## 2020-04-10 DIAGNOSIS — Z79899 Other long term (current) drug therapy: Secondary | ICD-10-CM | POA: Diagnosis not present

## 2020-04-10 DIAGNOSIS — Z1212 Encounter for screening for malignant neoplasm of rectum: Secondary | ICD-10-CM | POA: Diagnosis not present

## 2020-04-10 DIAGNOSIS — Z1331 Encounter for screening for depression: Secondary | ICD-10-CM | POA: Diagnosis not present

## 2020-04-10 DIAGNOSIS — E039 Hypothyroidism, unspecified: Secondary | ICD-10-CM | POA: Diagnosis not present

## 2020-04-10 DIAGNOSIS — R82998 Other abnormal findings in urine: Secondary | ICD-10-CM | POA: Diagnosis not present

## 2020-04-10 DIAGNOSIS — E669 Obesity, unspecified: Secondary | ICD-10-CM | POA: Diagnosis not present

## 2020-04-10 DIAGNOSIS — L409 Psoriasis, unspecified: Secondary | ICD-10-CM | POA: Diagnosis not present

## 2020-04-10 DIAGNOSIS — Z0000 Encounter for general adult medical examination without abnormal findings: Secondary | ICD-10-CM | POA: Diagnosis not present

## 2020-04-10 DIAGNOSIS — Z794 Long term (current) use of insulin: Secondary | ICD-10-CM | POA: Diagnosis not present

## 2020-04-10 DIAGNOSIS — M5416 Radiculopathy, lumbar region: Secondary | ICD-10-CM | POA: Diagnosis not present

## 2020-04-21 DIAGNOSIS — E1165 Type 2 diabetes mellitus with hyperglycemia: Secondary | ICD-10-CM | POA: Diagnosis not present

## 2020-04-21 DIAGNOSIS — Z794 Long term (current) use of insulin: Secondary | ICD-10-CM | POA: Diagnosis not present

## 2020-04-30 DIAGNOSIS — M549 Dorsalgia, unspecified: Secondary | ICD-10-CM | POA: Diagnosis not present

## 2020-06-23 DIAGNOSIS — R7401 Elevation of levels of liver transaminase levels: Secondary | ICD-10-CM | POA: Diagnosis not present

## 2020-06-23 DIAGNOSIS — E038 Other specified hypothyroidism: Secondary | ICD-10-CM | POA: Diagnosis not present

## 2020-06-23 DIAGNOSIS — R000 Tachycardia, unspecified: Secondary | ICD-10-CM | POA: Diagnosis not present

## 2020-06-23 DIAGNOSIS — E039 Hypothyroidism, unspecified: Secondary | ICD-10-CM | POA: Diagnosis not present

## 2020-06-23 DIAGNOSIS — Z794 Long term (current) use of insulin: Secondary | ICD-10-CM | POA: Diagnosis not present

## 2020-06-23 DIAGNOSIS — E1165 Type 2 diabetes mellitus with hyperglycemia: Secondary | ICD-10-CM | POA: Diagnosis not present

## 2020-06-24 ENCOUNTER — Other Ambulatory Visit: Payer: Self-pay | Admitting: *Deleted

## 2020-06-24 DIAGNOSIS — R000 Tachycardia, unspecified: Secondary | ICD-10-CM

## 2020-06-25 ENCOUNTER — Encounter: Payer: Self-pay | Admitting: *Deleted

## 2020-06-25 NOTE — Progress Notes (Signed)
Patient ID: Sonya Steele, female   DOB: 08-12-1951, 68 y.o.   MRN: [id number] Patient enrolled for [employer] to ship a 14 day cardiac event monitor to her home.  Letter with instructions mailed to patients home and will also be included in her monitor kit.

## 2020-07-01 ENCOUNTER — Encounter: Payer: Self-pay | Admitting: Interventional Cardiology

## 2020-07-01 ENCOUNTER — Ambulatory Visit (INDEPENDENT_AMBULATORY_CARE_PROVIDER_SITE_OTHER): Payer: Medicare Other

## 2020-07-01 DIAGNOSIS — R000 Tachycardia, unspecified: Secondary | ICD-10-CM | POA: Diagnosis not present

## 2020-07-02 ENCOUNTER — Telehealth: Payer: Self-pay | Admitting: Internal Medicine

## 2020-07-02 NOTE — Telephone Encounter (Signed)
Monitor strip received.  Shows 35 second run of SVT with rate of 170bpm.  Showed to Dr. Marlou Porch, DOD.  He recommends setting pt up to see our Electrophysiology team.  Called pt and left message to call back.

## 2020-07-03 ENCOUNTER — Telehealth: Payer: Self-pay

## 2020-07-03 NOTE — Telephone Encounter (Signed)
Spoke with pt and advised of monitor alert from 07/01/2020 at 438pm.  Pt reports she had no symptoms other than she could tell her "heart was beating fast."  Pt reports feeling well today.  Pt advised Dr Marlou Porch reviewed her monitor report and and would like pt to have referral to EP. Pt should continue to wear monitor.  Pt verbalizes understanding and is agreeable.  Pt advised scheduler will be calling to set up appointment.  Pt thanked Therapist, sports for call.

## 2020-07-03 NOTE — Telephone Encounter (Signed)
Spoke with pt and advised monitor alert received for 07/03/2020 at 825am.  Pt states she does not recall any symptoms at the time of alert and reports no symptoms at this time.  Monitor alert taken to Dr Harrington Challenger, DOD and advised Dr Marlou Porch has already referred pt to EP.  Appointment has been scheduled with Dr Quentin Ore for 07/22/2020 and pt is aware.

## 2020-07-06 ENCOUNTER — Telehealth: Payer: Self-pay

## 2020-07-06 NOTE — Telephone Encounter (Signed)
Received four different reports on patient having serious events over the weekend.  1-   07/03/20 9:21pm CT -shows SVT (60 sec)  2-   07/04/20 6:46 am CT -shows SVT (36 sec), Sinus rhythm with couplet PVCs  3-  07/04/20  9:54 pm CT -shows Sinus Rhythm, SVT (30 sec) with PACs  4-  07/05/20  11:41 am CT  -shows Sinus Rhythm with PSVT/PACs/Artifact  Patient stated she has not had any symptoms around these times that she is aware of. Patient stated she does have a palpitation here and there, and by the time she notices it, it is gone.   Patient is also having messages from her monitor that says-  Poor skin contact. Patient stated she did call [employer] about this and left a message. She states [employer] has not returned her call. Will send message to monitor tech to see if she can help patient with this issue.

## 2020-07-06 NOTE — Telephone Encounter (Signed)
Per message on 7/30 patient is aware of upcoming appointment with Dr. Quentin Ore.

## 2020-07-06 NOTE — Telephone Encounter (Signed)
Strips reviewed by Dr. Rayann Heman. "Long RP tachycardia.  Scheduled already to see Dr. Quentin Ore"

## 2020-07-10 DIAGNOSIS — E669 Obesity, unspecified: Secondary | ICD-10-CM | POA: Diagnosis not present

## 2020-07-10 DIAGNOSIS — E041 Nontoxic single thyroid nodule: Secondary | ICD-10-CM | POA: Diagnosis not present

## 2020-07-10 DIAGNOSIS — R002 Palpitations: Secondary | ICD-10-CM | POA: Diagnosis not present

## 2020-07-10 DIAGNOSIS — E039 Hypothyroidism, unspecified: Secondary | ICD-10-CM | POA: Diagnosis not present

## 2020-07-10 DIAGNOSIS — E785 Hyperlipidemia, unspecified: Secondary | ICD-10-CM | POA: Diagnosis not present

## 2020-07-10 DIAGNOSIS — I471 Supraventricular tachycardia: Secondary | ICD-10-CM | POA: Diagnosis not present

## 2020-07-10 DIAGNOSIS — E1169 Type 2 diabetes mellitus with other specified complication: Secondary | ICD-10-CM | POA: Diagnosis not present

## 2020-07-10 DIAGNOSIS — Z794 Long term (current) use of insulin: Secondary | ICD-10-CM | POA: Diagnosis not present

## 2020-07-14 ENCOUNTER — Other Ambulatory Visit: Payer: Self-pay | Admitting: Internal Medicine

## 2020-07-14 DIAGNOSIS — E041 Nontoxic single thyroid nodule: Secondary | ICD-10-CM

## 2020-07-19 NOTE — Progress Notes (Signed)
Cardiology Office Note:    Date:  07/22/2020   ID:  Sonya Steele, DOB May 27, 1951, MRN [id number]  PCP:  Velna Hatchet, MD  [facility] Cardiologist:  No primary care provider on file.  [facility] Electrophysiologist:  Vickie Epley, MD   Referring MD: Jerline Pain, MD   No chief complaint on file. SVT  History of Present Illness:    Sonya Steele is a 69 y.o. female with a hx of [name], hypothyroidism, obstructive sleep apnea who presents to the clinic for evaluation of episodes of SVT seen recently on a heart monitor.  She reports these episodes are symptomatic and resolved without specific intervention.  Past Medical History:  Diagnosis Date  . History of thyroid cancer   . Hypothyroidism   . Psoriasis     Past Surgical History:  Procedure Laterality Date  . APPENDECTOMY    . CESAREAN SECTION    . THYROIDECTOMY, PARTIAL    . TONSILLECTOMY      Current Medications: Current Meds  Medication Sig  . cholecalciferol (VITAMIN D) 1000 units tablet Take 1,000 Units by mouth daily.  Marland Kitchen levothyroxine (SYNTHROID) 100 MCG tablet Take 100 mcg by mouth at bedtime.  . meloxicam (MOBIC) 15 MG tablet Take 15 mg by mouth daily as needed for pain.  . Multiple Vitamin (MULTIVITAMIN WITH MINERALS) TABS tablet Take 1 tablet by mouth daily.  Orson Gear (SKYRIZI [place]) Inject into the skin. Every 90 days  . Semaglutide (OZEMPIC, 1 MG/DOSE, [place]) Inject into the skin once a week.     Allergies:   Patient has no known allergies.   Social History   Socioeconomic History  . Marital status: Married    Spouse name: Not on file  . Number of children: Not on file  . Years of education: Not on file  . Highest education level: Not on file  Occupational History  . Not on file  Tobacco Use  . Smoking status: Never Smoker  . Smokeless tobacco: Never Used  Substance and Sexual Activity  . Alcohol use: Yes  . Drug use: No  . Sexual activity: Not Currently  Other  Topics Concern  . Not on file  Social History Narrative  . Not on file   Social Determinants of Health   Financial Resource Strain:   . Difficulty of Paying Living Expenses:   Food Insecurity:   . Worried About Charity fundraiser in the Last Year:   . Arboriculturist in the Last Year:   Transportation Needs:   . Film/video editor (Medical):   Marland Kitchen Lack of Transportation (Non-Medical):   Physical Activity:   . Days of Exercise per Week:   . Minutes of Exercise per Session:   Stress:   . Feeling of Stress :   Social Connections:   . Frequency of Communication with Friends and Family:   . Frequency of Social Gatherings with Friends and Family:   . Attends Religious Services:   . Active Member of Clubs or Organizations:   . Attends Archivist Meetings:   Marland Kitchen Marital Status:      Family History: The patient's family history is not on file.  ROS:   Please see the history of present illness.     All other systems reviewed and are negative.  EKGs/Labs/Other Studies Reviewed:    The following studies were reviewed today: ECG, heart monitor results  EKG:  EKG shows sinus rhythm.  Recent Labs: No results  found for requested labs within last 8760 hours.  Recent Lipid Panel No results found for: CHOL, TRIG, HDL, CHOLHDL, VLDL, LDLCALC, LDLDIRECT  Physical Exam:    VS:  BP 124/86   Pulse 69   Ht 5\' 5"  (1.651 m)   Wt 173 lb 3.2 oz (78.6 kg)   SpO2 97%   BMI 28.82 kg/m     Wt Readings from Last 3 Encounters:  07/22/20 173 lb 3.2 oz (78.6 kg)  02/17/18 187 lb (84.8 kg)     GEN:  Well nourished, well developed in no acute distress HEENT: Normal NECK: No JVD; No carotid bruits LYMPHATICS: No lymphadenopathy CARDIAC: RRR, no murmurs, rubs, gallops RESPIRATORY:  Clear to auscultation without rales, wheezing or rhonchi  ABDOMEN: Soft, non-tender, non-distended MUSCULOSKELETAL:  No edema; No deformity  SKIN: Warm and dry NEUROLOGIC:  Alert and oriented x  3 PSYCHIATRIC:  Normal affect   ASSESSMENT:    1. Supraventricular tachycardia ([place])   2. OSA (obstructive sleep apnea)    PLAN:    In order of problems listed above:  1. Symptomatic supraventricular tachycardia The patient has multiple episodes of narrow complex tachycardia seen on recent heart monitor.  Some of the strips do not have an obvious P wave suggestive of a short RP tachycardia with ventricular rates in the 180s.  In other strips, there appears to be a P wave suggestive of a long RP tachycardia versus sinus tachycardia given rates that are slightly lower in the 130s-140s.   Given the unclear mechanism of her SVT, I recommended EP study with possible ablation.  Risks, benefits, alternatives to this procedure were discussed in detail with the patient and she elected to proceed.    Prior to the procedure, we will obtain an echocardiogram to confirm no structural abnormalities.   Medication Adjustments/Labs and Tests Ordered: Current medicines are reviewed at length with the patient today.  Concerns regarding medicines are outlined above.  Orders Placed This Encounter  Procedures  . Basic Metabolic Panel (BMET)  . CBC w/Diff  . EKG 12-Lead  . ECHOCARDIOGRAM COMPLETE   No orders of the defined types were placed in this encounter.   Patient Instructions  Medication Instructions:  Your physician recommends that you continue on your current medications as directed. Please refer to the Current Medication list given to you today.  Labwork: You will get lab work today:  BMP and CBC  Testing/Procedures: Your physician has requested that you have an echocardiogram. Echocardiography is a painless test that uses sound waves to create images of your heart. It provides your doctor with information about the size and shape of your heart and how well your heart's chambers and valves are working. This procedure takes approximately one hour. There are no restrictions for this  procedure.  Please schedule for ECHO  Follow-Up:  SEE INSTRUCTION LETTER  Any Other Special Instructions Will Be Listed Below (If Applicable).  If you need a refill on your cardiac medications before your next appointment, please call your pharmacy.    Cardiac Ablation Cardiac ablation is a procedure to disable (ablate) a small amount of heart tissue in very specific places. The heart has many electrical connections. Sometimes these connections are abnormal and can cause the heart to beat very fast or irregularly. Ablating some of the problem areas can improve the heart rhythm or return it to normal. Ablation may be done for people who:  Have Wolff-Parkinson-White syndrome.  Have fast heart rhythms (tachycardia).  Have taken medicines  for an abnormal heart rhythm (arrhythmia) that were not effective or caused side effects.  Have a high-risk heartbeat that may be life-threatening. During the procedure, a small incision is made in the neck or the groin, and a long, thin, flexible tube (catheter) is inserted into the incision and moved to the heart. Small devices (electrodes) on the tip of the catheter will send out electrical currents. A type of X-ray (fluoroscopy) will be used to help guide the catheter and to provide images of the heart. Tell a health care provider about:  Any allergies you have.  All medicines you are taking, including vitamins, herbs, eye drops, creams, and over-the-counter medicines.  Any problems you or family members have had with anesthetic medicines.  Any blood disorders you have.  Any surgeries you have had.  Any medical conditions you have, such as kidney failure.  Whether you are pregnant or may be pregnant. What are the risks? Generally, this is a safe procedure. However, problems may occur, including:  Infection.  Bruising and bleeding at the catheter insertion site.  Bleeding into the chest, especially into the sac that surrounds the heart.  This is a serious complication.  Stroke or blood clots.  Damage to other structures or organs.  Allergic reaction to medicines or dyes.  Need for a permanent pacemaker if the normal electrical system is damaged. A pacemaker is a small computer that sends electrical signals to the heart and helps your heart beat normally.  The procedure not being fully effective. This may not be recognized until months later. Repeat ablation procedures are sometimes required. What happens before the procedure?  Follow instructions from your health care provider about eating or drinking restrictions.  Ask your health care provider about: ? Changing or stopping your regular medicines. This is especially important if you are taking diabetes medicines or blood thinners. ? Taking medicines such as aspirin and ibuprofen. These medicines can thin your blood. Do not take these medicines before your procedure if your health care provider instructs you not to.  Plan to have someone take you home from the hospital or clinic.  If you will be going home right after the procedure, plan to have someone with you for 24 hours. What happens during the procedure?  To lower your risk of infection: ? Your health care team will wash or sanitize their hands. ? Your skin will be washed with soap. ? Hair may be removed from the incision area.  An IV tube will be inserted into one of your veins.  You will be given a medicine to help you relax (sedative).  The skin on your neck or groin will be numbed.  An incision will be made in your neck or your groin.  A needle will be inserted through the incision and into a large vein in your neck or groin.  A catheter will be inserted into the needle and moved to your heart.  Dye may be injected through the catheter to help your surgeon see the area of the heart that needs treatment.  Electrical currents will be sent from the catheter to ablate heart tissue in desired areas.  There are three types of energy that may be used to ablate heart tissue: ? Heat (radiofrequency energy). ? Laser energy. ? Extreme cold (cryoablation).  When the necessary tissue has been ablated, the catheter will be removed.  Pressure will be held on the catheter insertion area to prevent excessive bleeding.  A bandage (dressing) will be placed  over the catheter insertion area. The procedure may vary among health care providers and hospitals. What happens after the procedure?  Your blood pressure, heart rate, breathing rate, and blood oxygen level will be monitored until the medicines you were given have worn off.  Your catheter insertion area will be monitored for bleeding. You will need to lie still for a few hours to ensure that you do not bleed from the catheter insertion area.  Do not drive for 24 hours or as long as directed by your health care provider. Summary  Cardiac ablation is a procedure to disable (ablate) a small amount of heart tissue in very specific places. Ablating some of the problem areas can improve the heart rhythm or return it to normal.  During the procedure, electrical currents will be sent from the catheter to ablate heart tissue in desired areas. This information is not intended to replace advice given to you by your health care provider. Make sure you discuss any questions you have with your health care provider. Document Revised: 05/14/2018 Document Reviewed: 10/10/2016 Elsevier Patient Education  2020 [place], Vickie Epley, MD  07/22/2020 4:10 PM    [facility]

## 2020-07-19 NOTE — H&P (View-Only) (Signed)
Cardiology Office Note:    Date:  07/22/2020   ID:  Sonya Steele, DOB [birth year], MRN [id number]  PCP:  Sonya Hatchet, MD  [facility] Cardiologist:  No primary care provider on file.  [facility] Electrophysiologist:  Sonya Epley, MD   Referring MD: Sonya Pain, MD   No chief complaint on file. SVT  History of Present Illness:    Sonya Steele is a 69 y.o. female with a hx of Sonya Steele, hypothyroidism, obstructive sleep apnea who presents to the clinic for evaluation of episodes of SVT seen recently on a heart monitor.  She reports these episodes are symptomatic and resolved without specific intervention.  Past Medical History:  Diagnosis Date  . History of thyroid cancer   . Hypothyroidism   . Psoriasis     Past Surgical History:  Procedure Laterality Date  . APPENDECTOMY    . CESAREAN SECTION    . THYROIDECTOMY, PARTIAL    . TONSILLECTOMY      Current Medications: Current Meds  Medication Sig  . cholecalciferol (VITAMIN D) 1000 units tablet Take 1,000 Units by mouth daily.  Marland Kitchen levothyroxine (SYNTHROID) 100 MCG tablet Take 100 mcg by mouth at bedtime.  . meloxicam (MOBIC) 15 MG tablet Take 15 mg by mouth daily as needed for Steele.  . Multiple Vitamin (MULTIVITAMIN WITH MINERALS) TABS tablet Take 1 tablet by mouth daily.  Orson Gear (SKYRIZI [place]) Inject into the skin. Every 90 days  . Semaglutide (OZEMPIC, 1 MG/DOSE, [place]) Inject into the skin once a week.     Allergies:   Patient has no known allergies.   Social History   Socioeconomic History  . Marital status: Married    Spouse name: Not on file  . Number of children: Not on file  . Years of education: Not on file  . Highest education level: Not on file  Occupational History  . Not on file  Tobacco Use  . Smoking status: Never Smoker  . Smokeless tobacco: Never Used  Substance and Sexual Activity  . Alcohol use: Yes  . Drug use: No  . Sexual activity: Not Currently  Other  Topics Concern  . Not on file  Social History Narrative  . Not on file   Social Determinants of Health   Financial Resource Strain:   . Difficulty of Paying Living Expenses:   Food Insecurity:   . Worried About Charity fundraiser in the Last Year:   . Arboriculturist in the Last Year:   Transportation Needs:   . Film/video editor (Medical):   Marland Kitchen Lack of Transportation (Non-Medical):   Physical Activity:   . Days of Exercise per Week:   . Minutes of Exercise per Session:   Stress:   . Feeling of Stress :   Social Connections:   . Frequency of Communication with Friends and Family:   . Frequency of Social Gatherings with Friends and Family:   . Attends Religious Services:   . Active Member of Clubs or Organizations:   . Attends Archivist Meetings:   Marland Kitchen Marital Status:      Family History: The patient's family history is not on file.  ROS:   Please see the history of present illness.     All other systems reviewed and are negative.  EKGs/Labs/Other Studies Reviewed:    The following studies were reviewed today: ECG, heart monitor results  EKG:  EKG shows sinus rhythm.  Recent Labs: No results  found for requested labs within last 8760 hours.  Recent Lipid Panel No results found for: CHOL, TRIG, HDL, CHOLHDL, VLDL, LDLCALC, LDLDIRECT  Physical Exam:    VS:  BP 124/86   Pulse 69   Ht 5\' 5"  (1.651 m)   Wt 173 lb 3.2 oz (78.6 kg)   SpO2 97%   BMI 28.82 kg/m     Wt Readings from Last 3 Encounters:  07/22/20 173 lb 3.2 oz (78.6 kg)  02/17/18 187 lb (84.8 kg)     GEN:  Well nourished, well developed in no acute distress HEENT: Normal NECK: No JVD; No carotid bruits LYMPHATICS: No lymphadenopathy CARDIAC: RRR, no murmurs, rubs, gallops RESPIRATORY:  Clear to auscultation without rales, wheezing or rhonchi  ABDOMEN: Soft, non-tender, non-distended MUSCULOSKELETAL:  No edema; No deformity  SKIN: Warm and dry NEUROLOGIC:  Alert and oriented x  3 PSYCHIATRIC:  Normal affect   ASSESSMENT:    1. Supraventricular tachycardia ([place])   2. OSA (obstructive sleep apnea)    PLAN:    In order of problems listed above:  1. Symptomatic supraventricular tachycardia The patient has multiple episodes of narrow complex tachycardia seen on recent heart monitor.  Some of the strips do not have an obvious P wave suggestive of a short RP tachycardia with ventricular rates in the 180s.  In other strips, there appears to be a P wave suggestive of a long RP tachycardia versus sinus tachycardia given rates that are slightly lower in the 130s-140s.   Given the unclear mechanism of her SVT, I recommended EP study with possible ablation.  Risks, benefits, alternatives to this procedure were discussed in detail with the patient and she elected to proceed.    Prior to the procedure, we will obtain an echocardiogram to confirm no structural abnormalities.   Medication Adjustments/Labs and Tests Ordered: Current medicines are reviewed at length with the patient today.  Concerns regarding medicines are outlined above.  Orders Placed This Encounter  Procedures  . Basic Metabolic Panel (BMET)  . CBC w/Diff  . EKG 12-Lead  . ECHOCARDIOGRAM COMPLETE   No orders of the defined types were placed in this encounter.   Patient Instructions  Medication Instructions:  Your physician recommends that you continue on your current medications as directed. Please refer to the Current Medication list given to you today.  Labwork: You will get lab work today:  BMP and CBC  Testing/Procedures: Your physician has requested that you have an echocardiogram. Echocardiography is a painless test that uses sound waves to create images of your heart. It provides your doctor with information about the size and shape of your heart and how well your heart's chambers and valves are working. This procedure takes approximately one hour. There are no restrictions for this  procedure.  Please schedule for ECHO  Follow-Up:  SEE INSTRUCTION LETTER  Any Other Special Instructions Will Be Listed Below (If Applicable).  If you need a refill on your cardiac medications before your next appointment, please call your pharmacy.    Cardiac Ablation Cardiac ablation is a procedure to disable (ablate) a small amount of heart tissue in very specific places. The heart has many electrical connections. Sometimes these connections are abnormal and can cause the heart to beat very fast or irregularly. Ablating some of the problem areas can improve the heart rhythm or return it to normal. Ablation may be done for people who:  Have Wolff-Parkinson-White syndrome.  Have fast heart rhythms (tachycardia).  Have taken medicines  for an abnormal heart rhythm (arrhythmia) that were not effective or caused side effects.  Have a high-risk heartbeat that may be life-threatening. During the procedure, a small incision is made in the neck or the groin, and a long, thin, flexible tube (catheter) is inserted into the incision and moved to the heart. Small devices (electrodes) on the tip of the catheter will send out electrical currents. A type of X-ray (fluoroscopy) will be used to help guide the catheter and to provide images of the heart. Tell a health care provider about:  Any allergies you have.  All medicines you are taking, including vitamins, herbs, eye drops, creams, and over-the-counter medicines.  Any problems you or family members have had with anesthetic medicines.  Any blood disorders you have.  Any surgeries you have had.  Any medical conditions you have, such as kidney failure.  Whether you are pregnant or may be pregnant. What are the risks? Generally, this is a safe procedure. However, problems may occur, including:  Infection.  Bruising and bleeding at the catheter insertion site.  Bleeding into the chest, especially into the sac that surrounds the heart.  This is a serious complication.  Stroke or blood clots.  Damage to other structures or organs.  Allergic reaction to medicines or dyes.  Need for a permanent pacemaker if the normal electrical system is damaged. A pacemaker is a small computer that sends electrical signals to the heart and helps your heart beat normally.  The procedure not being fully effective. This may not be recognized until months later. Repeat ablation procedures are sometimes required. What happens before the procedure?  Follow instructions from your health care provider about eating or drinking restrictions.  Ask your health care provider about: ? Changing or stopping your regular medicines. This is especially important if you are taking diabetes medicines or blood thinners. ? Taking medicines such as aspirin and ibuprofen. These medicines can thin your blood. Do not take these medicines before your procedure if your health care provider instructs you not to.  Plan to have someone take you home from the hospital or clinic.  If you will be going home right after the procedure, plan to have someone with you for 24 hours. What happens during the procedure?  To lower your risk of infection: ? Your health care team will wash or sanitize their hands. ? Your skin will be washed with soap. ? Hair may be removed from the incision area.  An IV tube will be inserted into one of your veins.  You will be given a medicine to help you relax (sedative).  The skin on your neck or groin will be numbed.  An incision will be made in your neck or your groin.  A needle will be inserted through the incision and into a large vein in your neck or groin.  A catheter will be inserted into the needle and moved to your heart.  Dye may be injected through the catheter to help your surgeon see the area of the heart that needs treatment.  Electrical currents will be sent from the catheter to ablate heart tissue in desired areas.  There are three types of energy that may be used to ablate heart tissue: ? Heat (radiofrequency energy). ? Laser energy. ? Extreme cold (cryoablation).  When the necessary tissue has been ablated, the catheter will be removed.  Pressure will be held on the catheter insertion area to prevent excessive bleeding.  A bandage (dressing) will be placed  over the catheter insertion area. The procedure may vary among health care providers and hospitals. What happens after the procedure?  Your blood pressure, heart rate, breathing rate, and blood oxygen level will be monitored until the medicines you were given have worn off.  Your catheter insertion area will be monitored for bleeding. You will need to lie still for a few hours to ensure that you do not bleed from the catheter insertion area.  Do not drive for 24 hours or as long as directed by your health care provider. Summary  Cardiac ablation is a procedure to disable (ablate) a small amount of heart tissue in very specific places. Ablating some of the problem areas can improve the heart rhythm or return it to normal.  During the procedure, electrical currents will be sent from the catheter to ablate heart tissue in desired areas. This information is not intended to replace advice given to you by your health care provider. Make sure you discuss any questions you have with your health care provider. Document Revised: 05/14/2018 Document Reviewed: 10/10/2016 Elsevier Patient Education  2020 [place], Sonya Epley, MD  07/22/2020 4:10 PM    [facility]

## 2020-07-20 ENCOUNTER — Ambulatory Visit
Admission: RE | Admit: 2020-07-20 | Discharge: 2020-07-20 | Disposition: A | Discharge status: Home / Self Care | Payer: Medicare Other | Source: Ambulatory Visit | Attending: Internal Medicine | Admitting: Internal Medicine

## 2020-07-20 DIAGNOSIS — E039 Hypothyroidism, unspecified: Secondary | ICD-10-CM | POA: Diagnosis not present

## 2020-07-20 DIAGNOSIS — E041 Nontoxic single thyroid nodule: Secondary | ICD-10-CM

## 2020-07-22 ENCOUNTER — Encounter: Payer: Self-pay | Admitting: Cardiology

## 2020-07-22 ENCOUNTER — Other Ambulatory Visit: Payer: Self-pay

## 2020-07-22 ENCOUNTER — Ambulatory Visit (INDEPENDENT_AMBULATORY_CARE_PROVIDER_SITE_OTHER): Payer: Medicare Other | Admitting: Cardiology

## 2020-07-22 VITALS — BP 124/86 | HR 69 | Ht 65.0 in | Wt 173.2 lb

## 2020-07-22 DIAGNOSIS — I471 Supraventricular tachycardia: Secondary | ICD-10-CM | POA: Diagnosis not present

## 2020-07-22 DIAGNOSIS — G4733 Obstructive sleep apnea (adult) (pediatric): Secondary | ICD-10-CM

## 2020-07-22 NOTE — Patient Instructions (Addendum)
Medication Instructions:  Your physician recommends that you continue on your current medications as directed. Please refer to the Current Medication list given to you today.  Labwork: You will get lab work today:  BMP and CBC  Testing/Procedures: Your physician has requested that you have an echocardiogram. Echocardiography is a painless test that uses sound waves to create images of your heart. It provides your doctor with information about the size and shape of your heart and how well your heart's chambers and valves are working. This procedure takes approximately one hour. There are no restrictions for this procedure.  Please schedule for ECHO  Follow-Up:  SEE INSTRUCTION LETTER  Any Other Special Instructions Will Be Listed Below (If Applicable).  If you need a refill on your cardiac medications before your next appointment, please call your pharmacy.    Cardiac Ablation Cardiac ablation is a procedure to disable (ablate) a small amount of heart tissue in very specific places. The heart has many electrical connections. Sometimes these connections are abnormal and can cause the heart to beat very fast or irregularly. Ablating some of the problem areas can improve the heart rhythm or return it to normal. Ablation may be done for people who:  Have Wolff-Parkinson-White syndrome.  Have fast heart rhythms (tachycardia).  Have taken medicines for an abnormal heart rhythm (arrhythmia) that were not effective or caused side effects.  Have a high-risk heartbeat that may be life-threatening. During the procedure, a small incision is made in the neck or the groin, and a long, thin, flexible tube (catheter) is inserted into the incision and moved to the heart. Small devices (electrodes) on the tip of the catheter will send out electrical currents. A type of X-ray (fluoroscopy) will be used to help guide the catheter and to provide images of the heart. Tell a health care provider about:  Any  allergies you have.  All medicines you are taking, including vitamins, herbs, eye drops, creams, and over-the-counter medicines.  Any problems you or family members have had with anesthetic medicines.  Any blood disorders you have.  Any surgeries you have had.  Any medical conditions you have, such as kidney failure.  Whether you are pregnant or may be pregnant. What are the risks? Generally, this is a safe procedure. However, problems may occur, including:  Infection.  Bruising and bleeding at the catheter insertion site.  Bleeding into the chest, especially into the sac that surrounds the heart. This is a serious complication.  Stroke or blood clots.  Damage to other structures or organs.  Allergic reaction to medicines or dyes.  Need for a permanent pacemaker if the normal electrical system is damaged. A pacemaker is a small computer that sends electrical signals to the heart and helps your heart beat normally.  The procedure not being fully effective. This may not be recognized until months later. Repeat ablation procedures are sometimes required. What happens before the procedure?  Follow instructions from your health care provider about eating or drinking restrictions.  Ask your health care provider about: ? Changing or stopping your regular medicines. This is especially important if you are taking diabetes medicines or blood thinners. ? Taking medicines such as aspirin and ibuprofen. These medicines can thin your blood. Do not take these medicines before your procedure if your health care provider instructs you not to.  Plan to have someone take you home from the hospital or clinic.  If you will be going home right after the procedure, plan to have  someone with you for 24 hours. What happens during the procedure?  To lower your risk of infection: ? Your health care team will wash or sanitize their hands. ? Your skin will be washed with soap. ? Hair may be removed  from the incision area.  An IV tube will be inserted into one of your veins.  You will be given a medicine to help you relax (sedative).  The skin on your neck or groin will be numbed.  An incision will be made in your neck or your groin.  A needle will be inserted through the incision and into a large vein in your neck or groin.  A catheter will be inserted into the needle and moved to your heart.  Dye may be injected through the catheter to help your surgeon see the area of the heart that needs treatment.  Electrical currents will be sent from the catheter to ablate heart tissue in desired areas. There are three types of energy that may be used to ablate heart tissue: ? Heat (radiofrequency energy). ? Laser energy. ? Extreme cold (cryoablation).  When the necessary tissue has been ablated, the catheter will be removed.  Pressure will be held on the catheter insertion area to prevent excessive bleeding.  A bandage (dressing) will be placed over the catheter insertion area. The procedure may vary among health care providers and hospitals. What happens after the procedure?  Your blood pressure, heart rate, breathing rate, and blood oxygen level will be monitored until the medicines you were given have worn off.  Your catheter insertion area will be monitored for bleeding. You will need to lie still for a few hours to ensure that you do not bleed from the catheter insertion area.  Do not drive for 24 hours or as long as directed by your health care provider. Summary  Cardiac ablation is a procedure to disable (ablate) a small amount of heart tissue in very specific places. Ablating some of the problem areas can improve the heart rhythm or return it to normal.  During the procedure, electrical currents will be sent from the catheter to ablate heart tissue in desired areas. This information is not intended to replace advice given to you by your health care provider. Make sure you  discuss any questions you have with your health care provider. Document Revised: 05/14/2018 Document Reviewed: 10/10/2016 Elsevier Patient Education  [place].

## 2020-07-23 LAB — BASIC METABOLIC PANEL
BUN/Creatinine Ratio: 22 (ref 12–28)
BUN: 14 mg/dL (ref 8–27)
CO2: 26 mmol/L (ref 20–29)
Calcium: 9.5 mg/dL (ref 8.7–10.3)
Chloride: 102 mmol/L (ref 96–106)
Creatinine, Ser: 0.65 mg/dL (ref 0.57–1.00)
GFR calc Af Amer: 104 mL/min/{1.73_m2} (ref >59)
GFR calc non Af Amer: 90 mL/min/{1.73_m2} (ref >59)
Glucose: 110 mg/dL — ABNORMAL HIGH (ref 65–99)
Potassium: 4.3 mmol/L (ref 3.5–5.2)
Sodium: 142 mmol/L (ref 134–144)

## 2020-07-23 LAB — CBC WITH DIFFERENTIAL/PLATELET
Basophils Absolute: 0.1 10*3/uL (ref 0.0–0.2)
Basos: 1 %
EOS (ABSOLUTE): 0.1 10*3/uL (ref 0.0–0.4)
Eos: 1 %
Hematocrit: 45.4 % (ref 34.0–46.6)
Hemoglobin: 15.1 g/dL (ref 11.1–15.9)
Immature Grans (Abs): 0 10*3/uL (ref 0.0–0.1)
Immature Granulocytes: 0 %
Lymphocytes Absolute: 2 10*3/uL (ref 0.7–3.1)
Lymphs: 24 %
MCH: 31.7 pg (ref 26.6–33.0)
MCHC: 33.3 g/dL (ref 31.5–35.7)
MCV: 95 fL (ref 79–97)
Monocytes Absolute: 0.7 10*3/uL (ref 0.1–0.9)
Monocytes: 9 %
Neutrophils Absolute: 5.7 10*3/uL (ref 1.4–7.0)
Neutrophils: 65 %
Platelets: 283 10*3/uL (ref 150–450)
RBC: 4.77 x10E6/uL (ref 3.77–5.28)
RDW: 11.7 % (ref 11.7–15.4)
WBC: 8.6 10*3/uL (ref 3.4–10.8)

## 2020-07-27 ENCOUNTER — Ambulatory Visit (HOSPITAL_COMMUNITY): Payer: Medicare Other | Attending: Cardiology

## 2020-07-27 ENCOUNTER — Other Ambulatory Visit: Payer: Self-pay

## 2020-07-27 DIAGNOSIS — I471 Supraventricular tachycardia: Secondary | ICD-10-CM | POA: Diagnosis not present

## 2020-07-27 LAB — ECHOCARDIOGRAM COMPLETE
Area-P 1/2: 4.44 cm2
S' Lateral: 3.1 cm

## 2020-07-30 ENCOUNTER — Telehealth: Payer: Self-pay | Admitting: Cardiology

## 2020-07-30 NOTE — Telephone Encounter (Signed)
New message   Pt said she could not respond in Mychart. Pt was able to get covid bosster shot on Aug 18th, same day as Dr. Quentin Ore appt.

## 2020-08-04 ENCOUNTER — Other Ambulatory Visit (HOSPITAL_COMMUNITY)
Admission: RE | Admit: 2020-08-04 | Discharge: 2020-08-04 | Disposition: A | Discharge status: Home / Self Care | Payer: Medicare Other | Source: Ambulatory Visit | Attending: Cardiology | Admitting: Cardiology

## 2020-08-04 DIAGNOSIS — Z01812 Encounter for preprocedural laboratory examination: Secondary | ICD-10-CM | POA: Diagnosis not present

## 2020-08-04 DIAGNOSIS — Z20822 Contact with and (suspected) exposure to covid-19: Secondary | ICD-10-CM | POA: Insufficient documentation

## 2020-08-04 LAB — SARS CORONAVIRUS 2 (TAT 6-24 HRS): SARS Coronavirus 2: NEGATIVE

## 2020-08-05 NOTE — Progress Notes (Signed)
Instructed patient on the following items: °Arrival time 0530 °Nothing to eat or drink after midnight °No meds AM of procedure °Responsible person to drive you home and stay with you for 24 hrs ° ° °   °

## 2020-08-05 NOTE — Anesthesia Preprocedure Evaluation (Addendum)
Anesthesia Evaluation  Patient identified by MRN, date of birth, ID band Patient awake    Reviewed: Allergy & Precautions, NPO status , Patient's Chart, lab work & pertinent test results  Airway Mallampati: I  TM Distance: >3 FB Neck ROM: Full    Dental no notable dental hx. (+) Teeth Intact, Dental Advisory Given   Pulmonary neg pulmonary ROS,    Pulmonary exam normal breath sounds clear to auscultation       Cardiovascular Normal cardiovascular exam+ dysrhythmias Supra Ventricular Tachycardia  Rhythm:Regular Rate:Normal     Neuro/Psych negative neurological ROS  negative psych ROS   GI/Hepatic negative GI ROS, Neg liver ROS,   Endo/Other  Hypothyroidism   Renal/GU negative Renal ROS  negative genitourinary   Musculoskeletal negative musculoskeletal ROS (+)   Abdominal   Peds  Hematology negative hematology ROS (+)   Anesthesia Other Findings   Reproductive/Obstetrics negative OB ROS                           Anesthesia Physical Anesthesia Plan  ASA: II  Anesthesia Plan: MAC   Post-op Pain Management:    Induction:   PONV Risk Score and Plan: 3 and Treatment may vary due to age or medical condition, Propofol infusion and TIVA  Airway Management Planned: Natural Airway and Simple Face Mask  Additional Equipment: None  Intra-op Plan:   Post-operative Plan:   Informed Consent: I have reviewed the patients History and Physical, chart, labs and discussed the procedure including the risks, benefits and alternatives for the proposed anesthesia with the patient or authorized representative who has indicated his/her understanding and acceptance.       Plan Discussed with: CRNA  Anesthesia Plan Comments:       Anesthesia Quick Evaluation

## 2020-08-06 ENCOUNTER — Ambulatory Visit (HOSPITAL_COMMUNITY): Payer: Medicare Other | Admitting: Anesthesiology

## 2020-08-06 ENCOUNTER — Ambulatory Visit (HOSPITAL_COMMUNITY)
Admission: RE | Admit: 2020-08-06 | Discharge: 2020-08-06 | Disposition: A | Discharge status: Home / Self Care | Payer: Medicare Other | Attending: Cardiology | Admitting: Cardiology

## 2020-08-06 ENCOUNTER — Encounter (HOSPITAL_COMMUNITY): Payer: Self-pay | Admitting: Cardiology

## 2020-08-06 ENCOUNTER — Encounter (HOSPITAL_COMMUNITY)
Admission: RE | Disposition: A | Discharge status: Home / Self Care | Payer: Self-pay | Source: Home / Self Care | Attending: Cardiology

## 2020-08-06 ENCOUNTER — Other Ambulatory Visit: Payer: Self-pay

## 2020-08-06 DIAGNOSIS — G4733 Obstructive sleep apnea (adult) (pediatric): Secondary | ICD-10-CM | POA: Diagnosis not present

## 2020-08-06 DIAGNOSIS — E039 Hypothyroidism, unspecified: Secondary | ICD-10-CM | POA: Diagnosis not present

## 2020-08-06 DIAGNOSIS — Z79890 Hormone replacement therapy (postmenopausal): Secondary | ICD-10-CM | POA: Insufficient documentation

## 2020-08-06 DIAGNOSIS — I471 Supraventricular tachycardia: Secondary | ICD-10-CM | POA: Insufficient documentation

## 2020-08-06 DIAGNOSIS — Z79899 Other long term (current) drug therapy: Secondary | ICD-10-CM | POA: Insufficient documentation

## 2020-08-06 DIAGNOSIS — K7581 Nonalcoholic steatohepatitis (NASH): Secondary | ICD-10-CM | POA: Diagnosis not present

## 2020-08-06 DIAGNOSIS — Z85850 Personal history of malignant neoplasm of thyroid: Secondary | ICD-10-CM | POA: Insufficient documentation

## 2020-08-06 HISTORY — PX: SVT ABLATION: EP1225

## 2020-08-06 LAB — GLUCOSE, CAPILLARY: Glucose-Capillary: 98 mg/dL (ref 70–99)

## 2020-08-06 SURGERY — SVT ABLATION
Anesthesia: Monitor Anesthesia Care

## 2020-08-06 MED ORDER — BUPIVACAINE HCL (PF) 0.25 % IJ SOLN
INTRAMUSCULAR | Status: AC
  Filled 2020-08-06: qty 30

## 2020-08-06 MED ORDER — SODIUM CHLORIDE 0.9% FLUSH: 3.0000 mL | Freq: Two times a day (BID) | INTRAVENOUS | Status: DC

## 2020-08-06 MED ORDER — MIDAZOLAM HCL 2 MG/2ML IJ SOLN
INTRAMUSCULAR | Status: DC | PRN
  Administered 2020-08-06: 1 mg via INTRAVENOUS

## 2020-08-06 MED ORDER — SODIUM CHLORIDE 0.9% FLUSH: 3.0000 mL | INTRAVENOUS | Status: DC | PRN

## 2020-08-06 MED ORDER — HEPARIN (PORCINE) IN NACL 1000-0.9 UT/500ML-% IV SOLN
INTRAVENOUS | Status: DC | PRN
  Administered 2020-08-06 (×3): 500 mL

## 2020-08-06 MED ORDER — ISOPROTERENOL HCL 0.2 MG/ML IJ SOLN
INTRAVENOUS | Status: DC | PRN
  Administered 2020-08-06: 5 ug/min via INTRAVENOUS

## 2020-08-06 MED ORDER — SODIUM CHLORIDE 0.9 % IV SOLN: 250.0000 mL | INTRAVENOUS | Status: DC | PRN

## 2020-08-06 MED ORDER — METOPROLOL SUCCINATE ER 25 MG PO TB24: 25.0000 mg | ORAL_TABLET | Freq: Every day | ORAL | 3 refills | Status: DC

## 2020-08-06 MED ORDER — HEPARIN (PORCINE) IN NACL 1000-0.9 UT/500ML-% IV SOLN
INTRAVENOUS | Status: AC
  Filled 2020-08-06: qty 500

## 2020-08-06 MED ORDER — ONDANSETRON HCL 4 MG/2ML IJ SOLN: 4.0000 mg | Freq: Four times a day (QID) | INTRAMUSCULAR | Status: DC | PRN

## 2020-08-06 MED ORDER — BUPIVACAINE HCL (PF) 0.25 % IJ SOLN
INTRAMUSCULAR | Status: DC | PRN
  Administered 2020-08-06: 30 mL

## 2020-08-06 MED ORDER — ISOPROTERENOL HCL 0.2 MG/ML IJ SOLN
INTRAMUSCULAR | Status: AC
  Filled 2020-08-06: qty 5

## 2020-08-06 MED ORDER — SODIUM CHLORIDE 0.9 % IV SOLN: INTRAVENOUS | Status: DC

## 2020-08-06 MED ORDER — PROPOFOL 500 MG/50ML IV EMUL
INTRAVENOUS | Status: DC | PRN
  Administered 2020-08-06: 25 ug/kg/min via INTRAVENOUS

## 2020-08-06 MED ORDER — METOPROLOL TARTRATE 25 MG PO TABS
25.0000 mg | ORAL_TABLET | Freq: Once | ORAL | Status: AC
  Administered 2020-08-06: 25 mg via ORAL
  Filled 2020-08-06: qty 1

## 2020-08-06 MED ORDER — ONDANSETRON HCL 4 MG/2ML IJ SOLN
INTRAMUSCULAR | Status: DC | PRN
  Administered 2020-08-06: 4 mg via INTRAVENOUS

## 2020-08-06 MED ORDER — ACETAMINOPHEN 325 MG PO TABS: 650.0000 mg | ORAL_TABLET | ORAL | Status: DC | PRN

## 2020-08-06 SURGICAL SUPPLY — 15 items
BAG SNAP BAND KOVER 36X36 (MISCELLANEOUS) ×3 IMPLANT
CATH JOSEPHSON QUAD-ALLRED 6FR (CATHETERS) ×3 IMPLANT
CATH MAPPNG PENTARAY F 2-6-2MM (CATHETERS) ×1 IMPLANT
CATH QUAD COURNAND 5FR (CATHETERS) ×3 IMPLANT
CATH WEBSTER BI DIR CS D-F CRV (CATHETERS) ×3 IMPLANT
DEVICE CLOSURE PERCLS PRGLD 6F (VASCULAR PRODUCTS) ×3 IMPLANT
PACK EP LATEX FREE (CUSTOM PROCEDURE TRAY) ×3
PACK EP LF (CUSTOM PROCEDURE TRAY) ×1 IMPLANT
PAD PRO RADIOLUCENT 2001M-C (PAD) ×3 IMPLANT
PATCH CARTO3 (PAD) ×3 IMPLANT
PENTARAY F 2-6-2MM (CATHETERS) ×3
PERCLOSE PROGLIDE 6F (VASCULAR PRODUCTS) ×9
SHEATH PINNACLE 7F 10CM (SHEATH) ×3 IMPLANT
SHEATH PINNACLE 8F 10CM (SHEATH) ×6 IMPLANT
SHEATH PROBE COVER 6X72 (BAG) ×3 IMPLANT

## 2020-08-06 NOTE — Transfer of Care (Signed)
Immediate Anesthesia Transfer of Care Note  Patient: Sonya Steele  Procedure(s) Performed: SVT ABLATION (N/A )  Patient Location: PACU and Cath Lab  Anesthesia Type:MAC  Level of Consciousness: awake, alert  and oriented  Airway & Oxygen Therapy: Patient Spontanous Breathing and Patient connected to nasal cannula oxygen  Post-op Assessment: Report given to RN and Post -op Vital signs reviewed and stable  Post vital signs: Reviewed and stable   Last Vitals:  Vitals Value Taken Time  BP 156/108 08/06/20 0931  Temp    Pulse 126 08/06/20 0933  Resp 13 08/06/20 0933  SpO2 100 % 08/06/20 0933  Vitals shown include unvalidated device data.  Last Pain:  Vitals:   08/06/20 0630  TempSrc: Oral  PainSc:       Patients Stated Pain Goal: 5 (76/80/88 1103)  Complications: No complications documented.

## 2020-08-06 NOTE — Anesthesia Procedure Notes (Signed)
Procedure Name: MAC Date/Time: 08/06/2020 7:39 AM Performed by: Trinna Post., CRNA Pre-anesthesia Checklist: Patient identified, Emergency Drugs available, Suction available, Patient being monitored and Timeout performed Patient Re-evaluated:Patient Re-evaluated prior to induction Oxygen Delivery Method: Simple face mask Preoxygenation: Pre-oxygenation with 100% oxygen Induction Type: IV induction Placement Confirmation: positive ETCO2

## 2020-08-06 NOTE — Interval H&P Note (Signed)
History and Physical Interval Note:  08/06/2020 7:14 AM  Sonya Steele  has presented today for surgery, with the diagnosis of svt.  The various methods of treatment have been discussed with the patient and family. After consideration of risks, benefits and other options for treatment, the patient has consented to  Procedure(s): SVT ABLATION (N/A) as a surgical intervention.  The patient's history has been reviewed, patient examined, no change in status, stable for surgery.  I have reviewed the patient's chart and labs.  Questions were answered to the patient's satisfaction.     Trea Carnegie T Ayron Fillinger

## 2020-08-06 NOTE — Discharge Instructions (Signed)
Cardiac Ablation, Care After  This sheet gives you information about how to care for yourself after your procedure. Your health care provider may also give you more specific instructions. If you have problems or questions, contact your health care provider. What can I expect after the procedure? After the procedure, it is common to have:  Bruising around your puncture site.  Tenderness around your puncture site.  Skipped heartbeats.  Tiredness (fatigue).  Follow these instructions at home: Puncture site care   Follow instructions from your health care provider about how to take care of your puncture site. Make sure you: ? If present, leave stitches (sutures), skin glue, or adhesive strips in place. These skin closures may need to stay in place for up to 2 weeks. If adhesive strip edges start to loosen and curl up, you may trim the loose edges. Do not remove adhesive strips completely unless your health care provider tells you to do that. ? Remove your dressing in 24 hours. Then you may shower.  Check your puncture site every day for signs of infection. Check for: ? Redness, swelling, or pain. ? Fluid or blood. If your puncture site starts to bleed, lie down on your back, apply firm pressure to the area, and contact your health care provider. ? Warmth. ? Pus or a bad smell. Driving  Do not drive for at least 4 days after your procedure or however long your health care provider recommends. (Do not resume driving if you have previously been instructed not to drive for other health reasons.)  Do not drive or use heavy machinery while taking prescription pain medicine. Activity  Avoid activities that take a lot of effort for at least 7 days after your procedure.  Do not lift anything that is heavier than 5 lb (4.5 kg) for one week.   No sexual activity for 1 week.   Return to your normal activities as told by your health care provider. Ask your health care provider what activities  are safe for you. General instructions  Take over-the-counter and prescription medicines only as told by your health care provider.  Do not use any products that contain nicotine or tobacco, such as cigarettes and e-cigarettes. If you need help quitting, ask your health care provider.  You may shower after 24 hours, but Do not take baths, swim, or use a hot tub for 1 week.   Do not drink alcohol for 24 hours after your procedure.  Keep all follow-up visits as told by your health care provider. This is important. Contact a health care provider if:  You have redness, mild swelling, or pain around your puncture site.  You have fluid or blood coming from your puncture site that stops after applying firm pressure to the area.  Your puncture site feels warm to the touch.  You have pus or a bad smell coming from your puncture site.  You have a fever.  You have chest pain or discomfort that spreads to your neck, jaw, or arm.  You are sweating a lot.  You feel nauseous.  You have a fast or irregular heartbeat.  You have shortness of breath.  You are dizzy or light-headed and feel the need to lie down.  You have pain or numbness in the arm or leg closest to your puncture site. Get help right away if:  Your puncture site suddenly swells.  Your puncture site is bleeding and the bleeding does not stop after applying firm pressure to the area.  These symptoms may represent a serious problem that is an emergency. Do not wait to see if the symptoms will go away. Get medical help right away. Call your local emergency services (911 in the [place] Do not drive yourself to the hospital. Summary  After the procedure, it is normal to have bruising and tenderness at the puncture site in your groin, neck, or forearm.  Check your puncture site every day for signs of infection.  Get help right away if your puncture site is bleeding and the bleeding does not stop after applying firm pressure to  the area. This is a medical emergency. This information is not intended to replace advice given to you by your health care provider. Make sure you discuss any questions you have with your health care provider.

## 2020-08-06 NOTE — Anesthesia Postprocedure Evaluation (Signed)
Anesthesia Post Note  Patient: Sonya Steele  Procedure(s) Performed: SVT ABLATION (N/A )     Patient location during evaluation: PACU Anesthesia Type: MAC Level of consciousness: awake and alert Pain management: pain level controlled Vital Signs Assessment: post-procedure vital signs reviewed and stable Respiratory status: spontaneous breathing, nonlabored ventilation and respiratory function stable Cardiovascular status: blood pressure returned to baseline and stable Postop Assessment: no apparent nausea or vomiting Anesthetic complications: no   No complications documented.  Last Vitals:  Vitals:   08/06/20 1006 08/06/20 1010  BP:  (!) 142/99  Pulse:  68  Resp:  19  Temp: 36.9 C   SpO2:  99%    Last Pain:  Vitals:   08/06/20 1006  TempSrc: Temporal  PainSc: 0-No pain                 Pervis Hocking

## 2020-08-19 MED ORDER — FLECAINIDE ACETATE 100 MG PO TABS
100.0000 mg | ORAL_TABLET | Freq: Two times a day (BID) | ORAL | 3 refills | Status: DC
Start: 2020-08-19 — End: 2021-03-31

## 2020-08-19 MED ORDER — FLECAINIDE ACETATE 100 MG PO TABS: 100.0000 mg | ORAL_TABLET | Freq: Two times a day (BID) | ORAL | 3 refills | Status: DC

## 2020-08-19 NOTE — Telephone Encounter (Signed)
Flecainide called in to Pt's pharmacy  Await further needs

## 2020-09-06 NOTE — Progress Notes (Signed)
Electrophysiology Office Follow up Visit Note:    Date:  09/07/2020   ID:  Sonya Steele, DOB 1951-09-23, MRN [id number]  PCP:  Velna Hatchet, MD  [facility] Cardiologist:  No primary care provider on file.  [facility] Electrophysiologist:  Vickie Epley, MD    Interval History:    Sonya Steele is a 69 y.o. female who presents for a follow up visit. They were last seen in clinic 07/22/2020.  Since their last appointment, she underwent EP study which was significant for paroxysmal atrial tachycardia which was difficult to map given difficulties inducing the arrhythmia and its brief salvos in the lab. The area of earliest activation mapped to the area surrounding the His bundle. The procedure was stopped and she was started on flecainide.  Since starting Flecainide, she has experienced no further episodes of SVT or palpitations. She did have some bilateral back pain for 2 days after starting the flecainide that ultimately resolved and hasn't recurrence while still taking the medication.    Past Medical History:  Diagnosis Date  . History of thyroid cancer   . Hypothyroidism   . Psoriasis     Past Surgical History:  Procedure Laterality Date  . APPENDECTOMY    . CESAREAN SECTION    . SVT ABLATION N/A 08/06/2020   Procedure: SVT ABLATION;  Surgeon: Vickie Epley, MD;  Location: [facility];  Service: Cardiovascular;  Laterality: N/A;  . THYROIDECTOMY, PARTIAL    . TONSILLECTOMY      Current Medications: Current Meds  Medication Sig  . Barberry-Oreg Grape-Goldenseal (BERBERINE COMPLEX PO) Take 500 mg by mouth daily.  . Cholecalciferol (VITAMIN D) 125 MCG (5000 UT) CAPS Take 5,000 Units by mouth daily.   . clobetasol (TEMOVATE) 0.05 % external solution Apply 1 application topically 2 (two) times daily.  . flecainide (TAMBOCOR) 100 MG tablet Take 1 tablet (100 mg total) by mouth 2 (two) times daily.  Marland Kitchen levothyroxine (SYNTHROID) 100 MCG tablet Take  100 mcg by mouth at bedtime.  Marland Kitchen MAGNESIUM CITRATE PO Take 500 mg by mouth daily.  . meloxicam (MOBIC) 15 MG tablet Take 15 mg by mouth daily.   . metoprolol succinate (TOPROL XL) 25 MG 24 hr tablet Take 1 tablet (25 mg total) by mouth daily.  . Multiple Vitamin (MULTIVITAMIN WITH MINERALS) TABS tablet Take 1 tablet by mouth daily. Centrum Silver  . Risankizumab-rzaa (SKYRIZI) 150 MG/ML SOSY Inject 150 mg into the skin See admin instructions. Every 12 weeks  . Semaglutide (OZEMPIC, 1 MG/DOSE, [place]) Inject 1 mg into the skin once a week. Wednesday     Allergies:   Banana   Social History   Socioeconomic History  . Marital status: Married    Spouse name: Not on file  . Number of children: Not on file  . Years of education: Not on file  . Highest education level: Not on file  Occupational History  . Not on file  Tobacco Use  . Smoking status: Never Smoker  . Smokeless tobacco: Never Used  Substance and Sexual Activity  . Alcohol use: Yes  . Drug use: No  . Sexual activity: Not Currently  Other Topics Concern  . Not on file  Social History Narrative  . Not on file   Social Determinants of Health   Financial Resource Strain:   . Difficulty of Paying Living Expenses: Not on file  Food Insecurity:   . Worried About Charity fundraiser in the Last Year:  Not on file  . Ran Out of Food in the Last Year: Not on file  Transportation Needs:   . Lack of Transportation (Medical): Not on file  . Lack of Transportation (Non-Medical): Not on file  Physical Activity:   . Days of Exercise per Week: Not on file  . Minutes of Exercise per Session: Not on file  Stress:   . Feeling of Stress : Not on file  Social Connections:   . Frequency of Communication with Friends and Family: Not on file  . Frequency of Social Gatherings with Friends and Family: Not on file  . Attends Religious Services: Not on file  . Active Member of Clubs or Organizations: Not on file  . Attends Archivist  Meetings: Not on file  . Marital Status: Not on file     Family History: The patient's family history is not on file.  ROS:   Please see the history of present illness.    All other systems reviewed and are negative.  EKGs/Labs/Other Studies Reviewed:    The following studies were reviewed today: ECG  EKG:  The ekg ordered today demonstrates sinus rhythm. QRS duration is < 157ms.   Recent Labs: 07/22/2020: BUN 14; Creatinine, Ser 0.65; Hemoglobin 15.1; Platelets 283; Potassium 4.3; Sodium 142  Recent Lipid Panel No results found for: CHOL, TRIG, HDL, CHOLHDL, VLDL, LDLCALC, LDLDIRECT  Physical Exam:    VS:  BP 120/60   Pulse 60   Ht 5\' 5"  (1.651 m)   Wt 169 lb (76.7 kg)   SpO2 96%   BMI 28.12 kg/m     Wt Readings from Last 3 Encounters:  09/07/20 169 lb (76.7 kg)  08/06/20 170 lb (77.1 kg)  07/22/20 173 lb 3.2 oz (78.6 kg)     GEN:  Well nourished, well developed in no acute distress HEENT: Normal NECK: No JVD; No carotid bruits LYMPHATICS: No lymphadenopathy CARDIAC: RRR, no murmurs, rubs, gallops RESPIRATORY:  Clear to auscultation without rales, wheezing or rhonchi  ABDOMEN: Soft, non-tender, non-distended MUSCULOSKELETAL:  No edema; No deformity  SKIN: Warm and dry NEUROLOGIC:  Alert and oriented x 3 PSYCHIATRIC:  Normal affect   ASSESSMENT:    1. Supraventricular tachycardia ([place])    PLAN:    In order of problems listed above:  1. SVT/AT Near the his on previous EP study. Flecainide has effectively suppressed the ectopic focus. She is tolerating the medication well. ECG is stable on therapy. Plan to continue flecainide and see back in 6 months.   Medication Adjustments/Labs and Tests Ordered: Current medicines are reviewed at length with the patient today.  Concerns regarding medicines are outlined above.  Orders Placed This Encounter  Procedures  . EKG 12-Lead   No orders of the defined types were placed in this  encounter.    Signed, Lars Mage, MD, [facility]  09/07/2020 11:37 AM    Electrophysiology [place]

## 2020-09-07 ENCOUNTER — Other Ambulatory Visit: Payer: Self-pay

## 2020-09-07 ENCOUNTER — Encounter: Payer: Self-pay | Admitting: Cardiology

## 2020-09-07 ENCOUNTER — Ambulatory Visit (INDEPENDENT_AMBULATORY_CARE_PROVIDER_SITE_OTHER): Payer: Medicare Other | Admitting: Cardiology

## 2020-09-07 VITALS — BP 120/60 | HR 60 | Ht 65.0 in | Wt 169.0 lb

## 2020-09-07 DIAGNOSIS — I471 Supraventricular tachycardia: Secondary | ICD-10-CM | POA: Diagnosis not present

## 2020-09-07 NOTE — Patient Instructions (Addendum)
Medication Instructions:  Your physician recommends that you continue on your current medications as directed. Please refer to the Current Medication list given to you today. *If you need a refill on your cardiac medications before your next appointment, please call your pharmacy*  Lab Work: None ordered.  If you have labs (blood work) drawn today and your tests are completely normal, you will receive your results only by: . MyChart Message (if you have MyChart) OR . A paper copy in the mail If you have any lab test that is abnormal or we need to change your treatment, we will call you to review the results.  Testing/Procedures: None ordered.  Follow-Up: At [facility], you and your health needs are our priority.  As part of our continuing mission to provide you with exceptional heart care, we have created designated Provider Care Teams.  These Care Teams include your primary Cardiologist (physician) and Advanced Practice Providers (APPs -  Physician Assistants and Nurse Practitioners) who all work together to provide you with the care you need, when you need it.  Your next appointment:   Your physician wants you to follow-up in: 6 months with Dr. Lambert.  You will receive a reminder letter in the mail two months in advance. If you don't receive a letter, please call our office to schedule the follow-up appointment.   

## 2020-09-16 DIAGNOSIS — Z23 Encounter for immunization: Secondary | ICD-10-CM | POA: Diagnosis not present

## 2020-09-17 DIAGNOSIS — K029 Dental caries, unspecified: Secondary | ICD-10-CM | POA: Diagnosis not present

## 2020-09-22 DIAGNOSIS — E1165 Type 2 diabetes mellitus with hyperglycemia: Secondary | ICD-10-CM | POA: Diagnosis not present

## 2020-09-22 DIAGNOSIS — K760 Fatty (change of) liver, not elsewhere classified: Secondary | ICD-10-CM | POA: Diagnosis not present

## 2020-10-01 DIAGNOSIS — M720 Palmar fascial fibromatosis [Dupuytren]: Secondary | ICD-10-CM | POA: Diagnosis not present

## 2020-10-01 DIAGNOSIS — L821 Other seborrheic keratosis: Secondary | ICD-10-CM | POA: Diagnosis not present

## 2020-10-01 DIAGNOSIS — Z79899 Other long term (current) drug therapy: Secondary | ICD-10-CM | POA: Diagnosis not present

## 2020-10-01 DIAGNOSIS — L409 Psoriasis, unspecified: Secondary | ICD-10-CM | POA: Diagnosis not present

## 2020-11-23 DIAGNOSIS — Z03818 Encounter for observation for suspected exposure to other biological agents ruled out: Secondary | ICD-10-CM | POA: Diagnosis not present

## 2020-11-23 DIAGNOSIS — Z20822 Contact with and (suspected) exposure to covid-19: Secondary | ICD-10-CM | POA: Diagnosis not present

## 2020-12-16 DIAGNOSIS — M2550 Pain in unspecified joint: Secondary | ICD-10-CM | POA: Diagnosis not present

## 2020-12-16 DIAGNOSIS — M7989 Other specified soft tissue disorders: Secondary | ICD-10-CM | POA: Diagnosis not present

## 2020-12-16 DIAGNOSIS — M159 Polyosteoarthritis, unspecified: Secondary | ICD-10-CM | POA: Diagnosis not present

## 2020-12-16 DIAGNOSIS — L409 Psoriasis, unspecified: Secondary | ICD-10-CM | POA: Diagnosis not present

## 2020-12-30 DIAGNOSIS — M1990 Unspecified osteoarthritis, unspecified site: Secondary | ICD-10-CM | POA: Diagnosis not present

## 2020-12-30 DIAGNOSIS — E1165 Type 2 diabetes mellitus with hyperglycemia: Secondary | ICD-10-CM | POA: Diagnosis not present

## 2020-12-30 DIAGNOSIS — I471 Supraventricular tachycardia: Secondary | ICD-10-CM | POA: Diagnosis not present

## 2020-12-30 DIAGNOSIS — K760 Fatty (change of) liver, not elsewhere classified: Secondary | ICD-10-CM | POA: Diagnosis not present

## 2021-03-31 ENCOUNTER — Other Ambulatory Visit: Payer: Self-pay

## 2021-03-31 ENCOUNTER — Ambulatory Visit (INDEPENDENT_AMBULATORY_CARE_PROVIDER_SITE_OTHER): Payer: Medicare Other | Admitting: Student

## 2021-03-31 ENCOUNTER — Encounter: Payer: Self-pay | Admitting: Student

## 2021-03-31 VITALS — BP 150/80 | HR 52 | Ht 65.0 in | Wt 178.0 lb

## 2021-03-31 DIAGNOSIS — R000 Tachycardia, unspecified: Secondary | ICD-10-CM

## 2021-03-31 DIAGNOSIS — I471 Supraventricular tachycardia: Secondary | ICD-10-CM

## 2021-03-31 MED ORDER — FLECAINIDE ACETATE 50 MG PO TABS: 50.0000 mg | ORAL_TABLET | Freq: Two times a day (BID) | ORAL | 3 refills | Status: DC

## 2021-03-31 NOTE — Patient Instructions (Signed)
Medication Instructions:  Your physician has recommended you make the following change in your medication:   DECREASE: Flecainide to 50mg  twice daily  *If you need a refill on your cardiac medications before your next appointment, please call your pharmacy*   Lab Work: None If you have labs (blood work) drawn today and your tests are completely normal, you will receive your results only by: Marland Kitchen MyChart Message (if you have MyChart) OR . A paper copy in the mail If you have any lab test that is abnormal or we need to change your treatment, we will call you to review the results.   Follow-Up: At [facility], you and your health needs are our priority.  As part of our continuing mission to provide you with exceptional heart care, we have created designated Provider Care Teams.  These Care Teams include your primary Cardiologist (physician) and Advanced Practice Providers (APPs -  Physician Assistants and Nurse Practitioners) who all work together to provide you with the care you need, when you need it.  Your next appointment:   6 month(s)  The format for your next appointment:   In Person  Provider:   You may see Vickie Epley, MD or one of the following Advanced Practice Providers on your designated Care Team:    Chanetta Marshall, NP  Tommye Standard, PA-C  Legrand Como "Shannon" [place], [place]

## 2021-03-31 NOTE — Progress Notes (Signed)
PCP:  Velna Hatchet, MD Primary Cardiologist: No primary care provider on file. Electrophysiologist: Vickie Epley, MD   Sonya Steele is a 70 y.o. female seen today for Vickie Epley, MD for routine electrophysiology followup.  Since last being seen in our clinic the patient reports doing well overall. She feels like flecainide caused constipation, so she has been taking 100 mg daily since her first refill. She has also managed it with colace. she denies chest pain, palpitations, dyspnea, PND, orthopnea, nausea, vomiting, dizziness, syncope, edema, weight gain, or early satiety.  Past Medical History:  Diagnosis Date  . History of thyroid cancer   . Hypothyroidism   . Psoriasis    Past Surgical History:  Procedure Laterality Date  . APPENDECTOMY    . CESAREAN SECTION    . SVT ABLATION N/A 08/06/2020   Procedure: SVT ABLATION;  Surgeon: Vickie Epley, MD;  Location: [facility];  Service: Cardiovascular;  Laterality: N/A;  . THYROIDECTOMY, PARTIAL    . TONSILLECTOMY      Current Outpatient Medications  Medication Sig Dispense Refill  . Barberry-Oreg Grape-Goldenseal (BERBERINE COMPLEX PO) Take 500 mg by mouth daily.    . Cholecalciferol (VITAMIN D) 125 MCG (5000 UT) CAPS Take 5,000 Units by mouth daily.     . clobetasol (TEMOVATE) 0.05 % external solution Apply 1 application topically 2 (two) times daily.    . flecainide (TAMBOCOR) 100 MG tablet Take 100 mg by mouth daily in the afternoon.    . Fluocinolone Acetonide Scalp 0.01 % OIL Apply topically 3 (three) times a week.    . levothyroxine (SYNTHROID) 100 MCG tablet Take 100 mcg by mouth at bedtime.    Marland Kitchen MAGNESIUM CITRATE PO Take 500 mg by mouth daily.    . meloxicam (MOBIC) 15 MG tablet Take 15 mg by mouth daily.    . metoprolol succinate (TOPROL XL) 25 MG 24 hr tablet Take 1 tablet (25 mg total) by mouth daily. 90 tablet 3  . Multiple Vitamin (MULTIVITAMIN WITH MINERALS) TABS tablet Take 1 tablet by  mouth daily. Centrum Silver    . Risankizumab-rzaa 150 MG/ML SOSY Inject 150 mg into the skin See admin instructions. Every 12 weeks    . Semaglutide (OZEMPIC, 1 MG/DOSE, [place]) Inject 1 mg into the skin once a week. Wednesday     No current facility-administered medications for this visit.    Allergies  Allergen Reactions  . Banana Swelling    Throat closed    Social History   Socioeconomic History  . Marital status: Married    Spouse name: Not on file  . Number of children: Not on file  . Years of education: Not on file  . Highest education level: Not on file  Occupational History  . Not on file  Tobacco Use  . Smoking status: Never Smoker  . Smokeless tobacco: Never Used  Substance and Sexual Activity  . Alcohol use: Yes  . Drug use: No  . Sexual activity: Not Currently  Other Topics Concern  . Not on file  Social History Narrative  . Not on file   Social Determinants of Health   Financial Resource Strain: Not on file  Food Insecurity: Not on file  Transportation Needs: Not on file  Physical Activity: Not on file  Stress: Not on file  Social Connections: Not on file  Intimate Partner Violence: Not on file     Review of Systems: General: No chills, fever, night sweats or  weight changes  Cardiovascular:  No chest pain, dyspnea on exertion, edema, orthopnea, palpitations, paroxysmal nocturnal dyspnea Dermatological: No rash, lesions or masses Respiratory: No cough, dyspnea Urologic: No hematuria, dysuria Abdominal: No nausea, vomiting, diarrhea, bright red blood per rectum, melena, or hematemesis Neurologic: No visual changes, weakness, changes in mental status All other systems reviewed and are otherwise negative except as noted above.  Physical Exam: Vitals:   03/31/21 1136  BP: (!) 150/80  Pulse: (!) 52  SpO2: 98%  Weight: 178 lb (80.7 kg)  Height: 5\' 5"  (1.651 m)    GEN- The patient is well appearing, alert and oriented x 3 today.   HEENT:  normocephalic, atraumatic; sclera clear, conjunctiva pink; hearing intact; oropharynx clear; neck supple, no JVP Lymph- no cervical lymphadenopathy Lungs- Clear to ausculation bilaterally, normal work of breathing.  No wheezes, rales, rhonchi Heart- Regular rate and rhythm, no murmurs, rubs or gallops, PMI not laterally displaced GI- soft, non-tender, non-distended, bowel sounds present, no hepatosplenomegaly Extremities- no clubbing, cyanosis, or edema; DP/PT/radial pulses 2+ bilaterally MS- no significant deformity or atrophy Skin- warm and dry, no rash or lesion Psych- euthymic mood, full affect Neuro- strength and sensation are intact  EKG is ordered. Personal review of EKG from today shows sinus bradycardia at 52 bpm. Stable QRS   Additional studies reviewed include: Previous EP office visits and EP study 08/06/2020  Assessment and Plan:  1. SVT/AT Near the HIS on previous EP study.  Flecainide 100 mg BID caused constipation, so she cut back to 100 mg daily.  Will change dosing to 50 mg BID  EKG stable as above.  She will obtain labs from PCP next month and send to [place].  RTC 6 months with Dr. Quentin Ore. Sooner with symptoms.   Shirley Friar, PA-C  03/31/21 12:02 PM

## 2021-04-16 DIAGNOSIS — E039 Hypothyroidism, unspecified: Secondary | ICD-10-CM | POA: Diagnosis not present

## 2021-04-16 DIAGNOSIS — E785 Hyperlipidemia, unspecified: Secondary | ICD-10-CM | POA: Diagnosis not present

## 2021-04-21 DIAGNOSIS — L821 Other seborrheic keratosis: Secondary | ICD-10-CM | POA: Diagnosis not present

## 2021-04-21 DIAGNOSIS — L409 Psoriasis, unspecified: Secondary | ICD-10-CM | POA: Diagnosis not present

## 2021-04-21 DIAGNOSIS — Z79899 Other long term (current) drug therapy: Secondary | ICD-10-CM | POA: Diagnosis not present

## 2021-04-22 DIAGNOSIS — Z1212 Encounter for screening for malignant neoplasm of rectum: Secondary | ICD-10-CM | POA: Diagnosis not present

## 2021-04-22 DIAGNOSIS — R82998 Other abnormal findings in urine: Secondary | ICD-10-CM | POA: Diagnosis not present

## 2021-07-16 ENCOUNTER — Other Ambulatory Visit: Payer: Self-pay | Admitting: Student

## 2021-07-21 ENCOUNTER — Other Ambulatory Visit: Payer: Self-pay | Admitting: Internal Medicine

## 2021-07-21 DIAGNOSIS — Z1231 Encounter for screening mammogram for malignant neoplasm of breast: Secondary | ICD-10-CM

## 2021-07-23 ENCOUNTER — Ambulatory Visit
Admission: RE | Admit: 2021-07-23 | Discharge: 2021-07-23 | Disposition: A | Discharge status: Home / Self Care | Payer: Medicare Other | Source: Ambulatory Visit | Attending: Internal Medicine | Admitting: Internal Medicine

## 2021-07-23 DIAGNOSIS — Z1231 Encounter for screening mammogram for malignant neoplasm of breast: Secondary | ICD-10-CM

## 2021-08-02 DIAGNOSIS — M79641 Pain in right hand: Secondary | ICD-10-CM | POA: Diagnosis not present

## 2021-08-02 DIAGNOSIS — R2231 Localized swelling, mass and lump, right upper limb: Secondary | ICD-10-CM | POA: Diagnosis not present

## 2021-08-02 DIAGNOSIS — M79642 Pain in left hand: Secondary | ICD-10-CM | POA: Diagnosis not present

## 2021-08-02 DIAGNOSIS — M720 Palmar fascial fibromatosis [Dupuytren]: Secondary | ICD-10-CM | POA: Diagnosis not present

## 2021-08-25 IMAGING — MG MM DIGITAL SCREENING BILAT W/ TOMO AND CAD
8 series · 9 of 24 positions shown · non-contrast
Comparison: Previous exam(s).

CLINICAL DATA: Screening.

EXAM:
DIGITAL SCREENING BILATERAL MAMMOGRAM WITH TOMOSYNTHESIS AND CAD
TECHNIQUE: Bilateral screening digital craniocaudal and mediolateral oblique
mammograms were obtained. Bilateral screening digital breast
tomosynthesis was performed. The images were evaluated with
computer-aided detection.

[R MLO synth-2D]
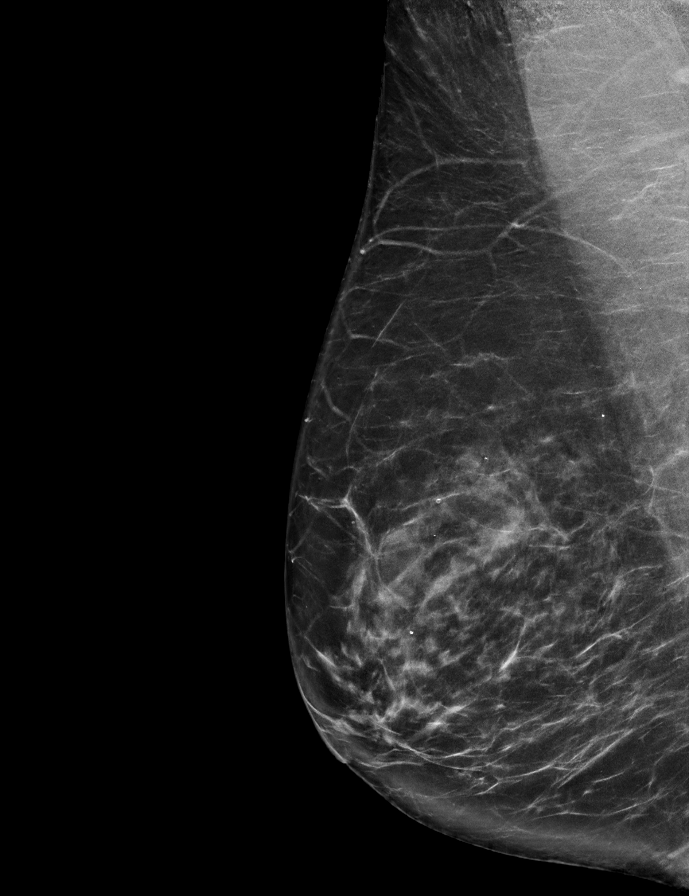

[L MLO synth-2D]
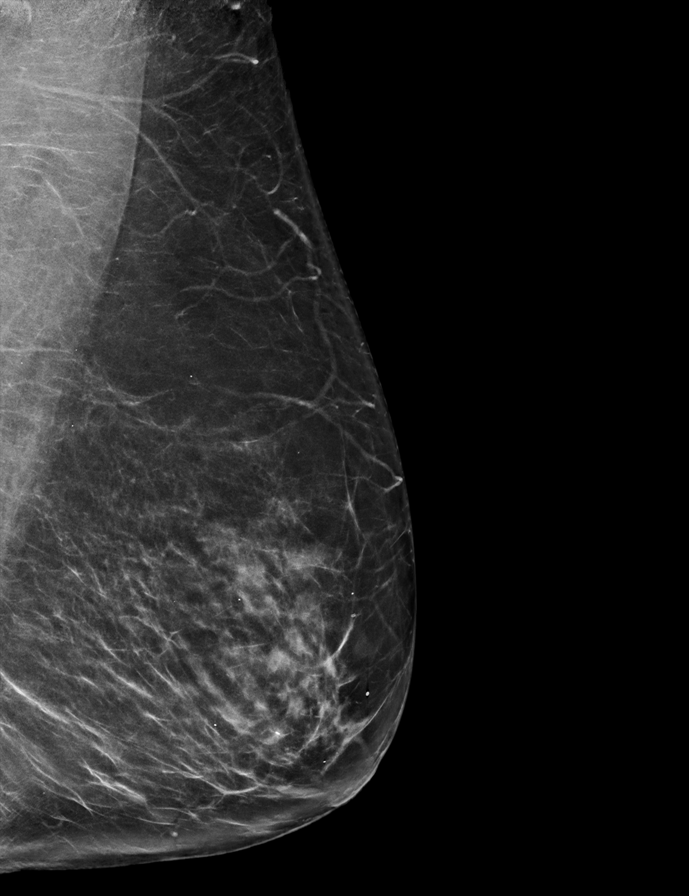

[R CC synth-2D]
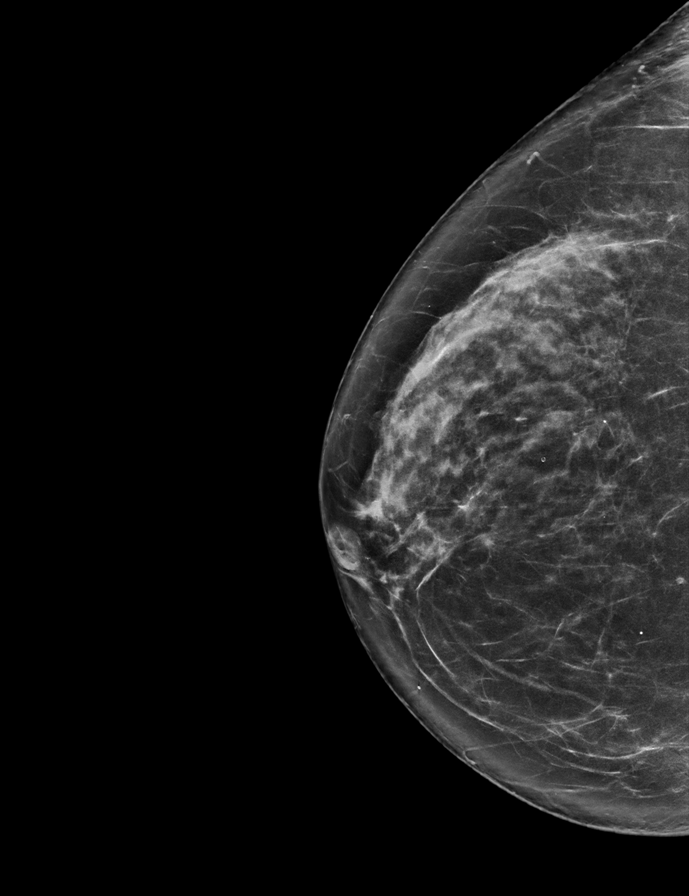

[L CC synth-2D]
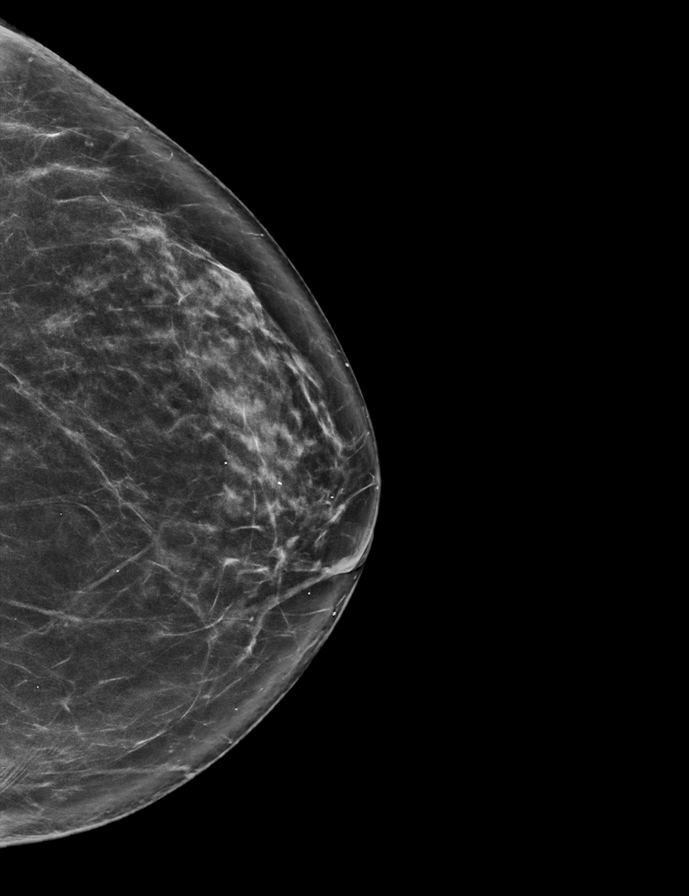

[R MLO tomo · 2 of 81 frames shown]
[frame 27/81]
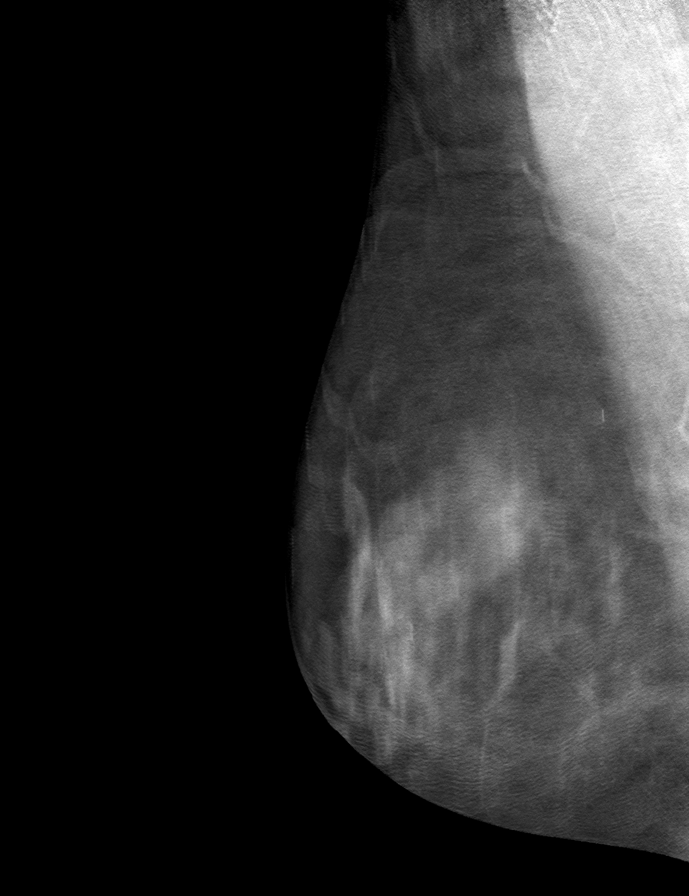
[frame 41/81]
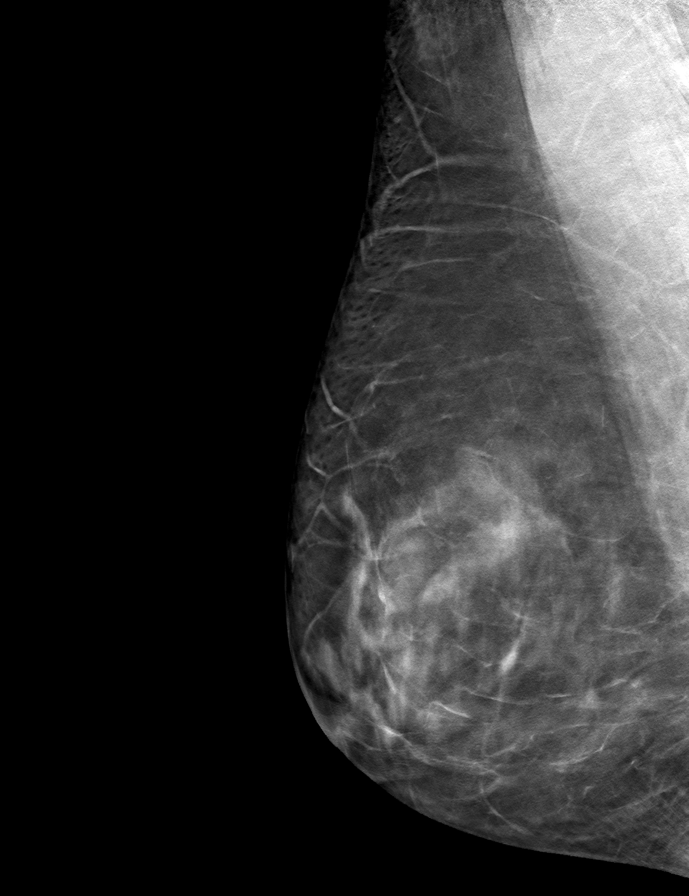

[R CC tomo · tomo slice 37/74.0]
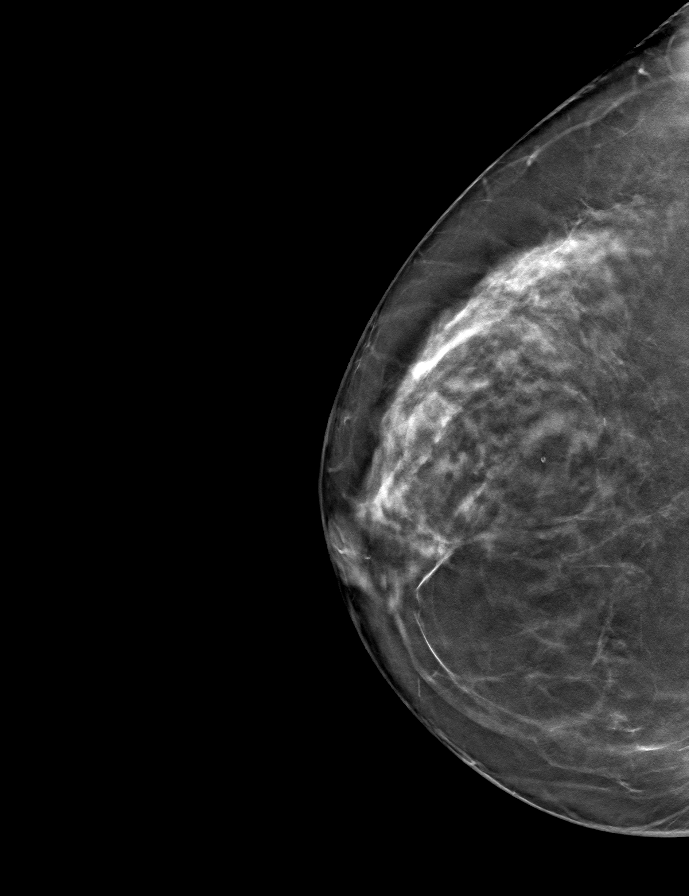

[L CC tomo · tomo slice 39/76.0]
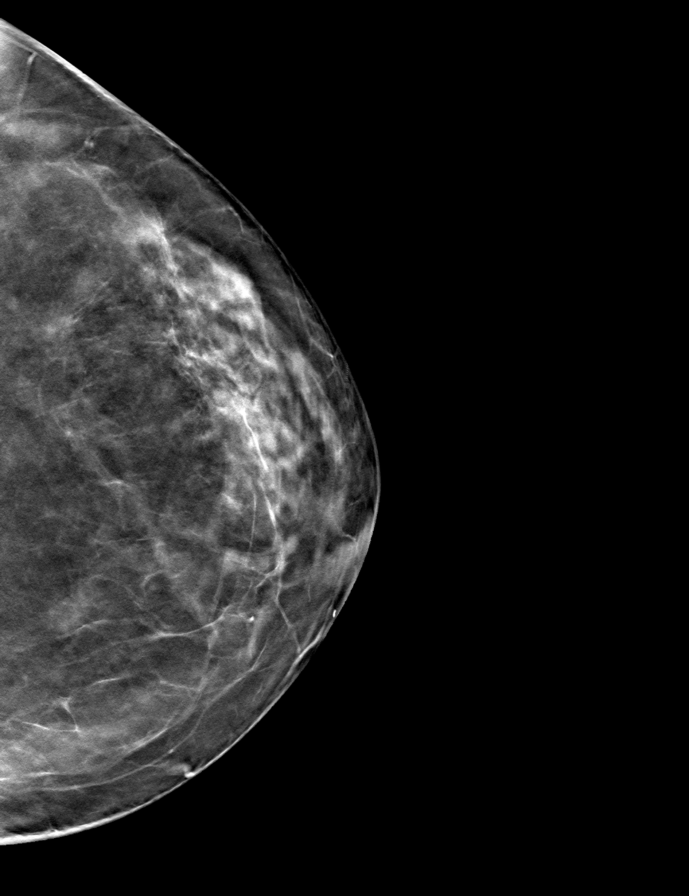

[L MLO tomo · tomo slice 38/75.0]
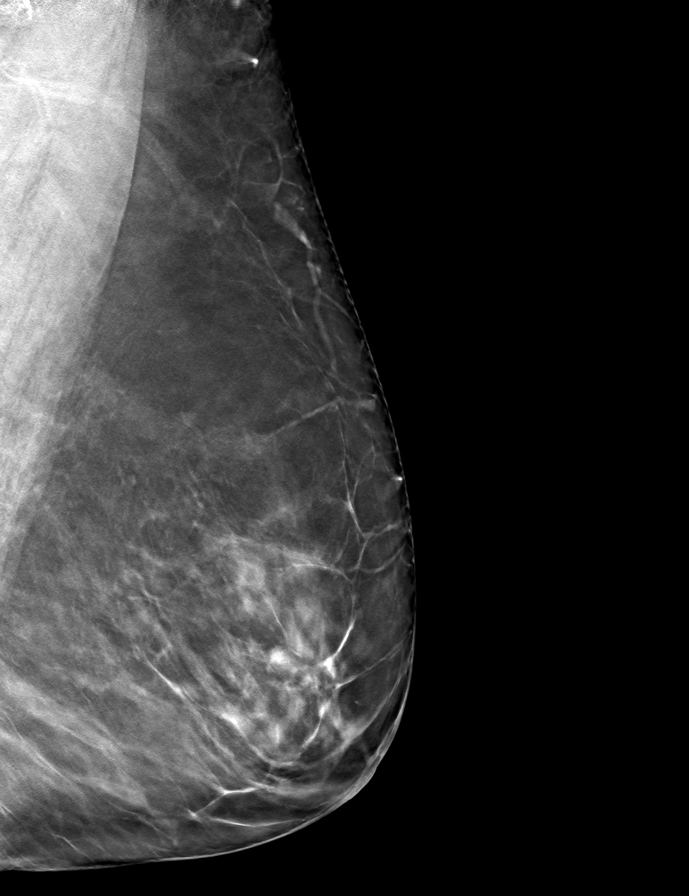

[9 of 24 positions shown; findings below may reference images not displayed]

ACR Breast Density Category b: There are scattered areas of
fibroglandular density.
FINDINGS: There are no findings suspicious for malignancy.
IMPRESSION: No mammographic evidence of malignancy. A result letter of this
screening mammogram will be mailed directly to the patient.

RECOMMENDATION:
Screening mammogram in one year. (Code:51-O-LD2)

BI-RADS CATEGORY  1: Negative.

## 2021-09-11 DIAGNOSIS — Z23 Encounter for immunization: Secondary | ICD-10-CM | POA: Diagnosis not present

## 2021-11-05 DIAGNOSIS — K760 Fatty (change of) liver, not elsewhere classified: Secondary | ICD-10-CM | POA: Diagnosis not present

## 2021-11-05 DIAGNOSIS — E785 Hyperlipidemia, unspecified: Secondary | ICD-10-CM | POA: Diagnosis not present

## 2021-11-05 DIAGNOSIS — E1169 Type 2 diabetes mellitus with other specified complication: Secondary | ICD-10-CM | POA: Diagnosis not present

## 2021-11-05 DIAGNOSIS — I471 Supraventricular tachycardia: Secondary | ICD-10-CM | POA: Diagnosis not present

## 2021-11-08 DIAGNOSIS — Z79899 Other long term (current) drug therapy: Secondary | ICD-10-CM | POA: Diagnosis not present

## 2021-11-08 DIAGNOSIS — L821 Other seborrheic keratosis: Secondary | ICD-10-CM | POA: Diagnosis not present

## 2021-11-08 DIAGNOSIS — L409 Psoriasis, unspecified: Secondary | ICD-10-CM | POA: Diagnosis not present

## 2021-11-08 DIAGNOSIS — Z23 Encounter for immunization: Secondary | ICD-10-CM | POA: Diagnosis not present

## 2022-03-22 ENCOUNTER — Other Ambulatory Visit: Payer: Self-pay | Admitting: Student

## 2022-03-22 ENCOUNTER — Other Ambulatory Visit: Payer: Self-pay | Admitting: Cardiology

## 2022-04-21 ENCOUNTER — Other Ambulatory Visit: Payer: Self-pay

## 2022-04-21 MED ORDER — METOPROLOL SUCCINATE ER 25 MG PO TB24: 25.0000 mg | ORAL_TABLET | Freq: Every day | ORAL | 0 refills | Status: DC

## 2022-04-29 DIAGNOSIS — E785 Hyperlipidemia, unspecified: Secondary | ICD-10-CM | POA: Diagnosis not present

## 2022-04-29 DIAGNOSIS — E039 Hypothyroidism, unspecified: Secondary | ICD-10-CM | POA: Diagnosis not present

## 2022-04-29 DIAGNOSIS — K760 Fatty (change of) liver, not elsewhere classified: Secondary | ICD-10-CM | POA: Diagnosis not present

## 2022-04-29 DIAGNOSIS — E1169 Type 2 diabetes mellitus with other specified complication: Secondary | ICD-10-CM | POA: Diagnosis not present

## 2022-05-06 DIAGNOSIS — Z1339 Encounter for screening examination for other mental health and behavioral disorders: Secondary | ICD-10-CM | POA: Diagnosis not present

## 2022-05-06 DIAGNOSIS — I471 Supraventricular tachycardia: Secondary | ICD-10-CM | POA: Diagnosis not present

## 2022-05-06 DIAGNOSIS — Z0000 Encounter for general adult medical examination without abnormal findings: Secondary | ICD-10-CM | POA: Diagnosis not present

## 2022-05-06 DIAGNOSIS — K760 Fatty (change of) liver, not elsewhere classified: Secondary | ICD-10-CM | POA: Diagnosis not present

## 2022-05-06 DIAGNOSIS — E1169 Type 2 diabetes mellitus with other specified complication: Secondary | ICD-10-CM | POA: Diagnosis not present

## 2022-05-06 DIAGNOSIS — Z1331 Encounter for screening for depression: Secondary | ICD-10-CM | POA: Diagnosis not present

## 2022-05-19 NOTE — Progress Notes (Signed)
Cardiology Office Note Date:  05/20/2022  Patient ID:  Sonya Steele, Sonya Steele [birth year], MRN [id number] PCP:  Velna Hatchet, MD  Electrophysiologist: Dr. Quentin Ore  Chief Complaint:  over due  History of Present Illness: Sonya Steele is a 71 y.o. female with history of thyroid ca s/p partial thyroidectomy > hypothyroidism, SVT.  She comes in today to be seen for Dr. Quentin Ore, last seen by him back in Oct 2021, she was s/p EPS, he discussed her procedure, noting: Her atrial tachycardia which was difficult to map given difficulties inducing the arrhythmia and its brief salvos in the lab. The area of earliest activation mapped to the area surrounding the His bundle. The procedure was stopped and she was started on flecainide She was tolerating flecainide, EKG stable Planned for 6 mo f/u  She saw A. Chalmers Cater, PA-C April 2022, she had reduced the flecainide to daily dosing 2/2 constipation and added colace. Changed to '50mg'$  BID, planned for 6 mo f/u  TODAY She is doing very well. She will only have some brief palpitations when she misses a dose of flecainide which is infrequent. No CP, SOB, DOE No near syncope or syncope.  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   AAD Flecainide Sept 2021 for ATach/SVT   Past Medical History:  Diagnosis Date   History of thyroid cancer    Hypothyroidism    Psoriasis     Past Surgical History:  Procedure Laterality Date   APPENDECTOMY     CESAREAN SECTION     SVT ABLATION N/A 08/06/2020   Procedure: SVT ABLATION;  Surgeon: Vickie Epley, MD;  Location: [facility];  Service: Cardiovascular;  Laterality: N/A;   THYROIDECTOMY, PARTIAL     TONSILLECTOMY      Current Outpatient Medications  Medication Sig Dispense Refill   Barberry-Oreg Grape-Goldenseal (BERBERINE COMPLEX PO) Take 500 mg by mouth daily.     Cholecalciferol (VITAMIN D) 125 MCG (5000 UT) CAPS Take  5,000 Units by mouth daily.      clobetasol (TEMOVATE) 0.05 % external solution Apply 1 application topically 2 (two) times daily.     flecainide (TAMBOCOR) 50 MG tablet TAKE 1 TABLET BY MOUTH TWICE A DAY 60 tablet 0   Fluocinolone Acetonide Scalp 0.01 % OIL Apply topically 3 (three) times a week.     levothyroxine (SYNTHROID) 100 MCG tablet Take 100 mcg by mouth at bedtime.     MAGNESIUM CITRATE PO Take 500 mg by mouth daily.     meloxicam (MOBIC) 15 MG tablet Take 15 mg by mouth daily.     metoprolol succinate (TOPROL-XL) 25 MG 24 hr tablet Take 1 tablet (25 mg total) by mouth daily. 15 tablet 0   Multiple Vitamin (MULTIVITAMIN WITH MINERALS) TABS tablet Take 1 tablet by mouth daily. Centrum Silver     Risankizumab-rzaa 150 MG/ML SOSY Inject 150 mg into the skin See admin instructions. Every 12 weeks     Semaglutide (OZEMPIC, 1 MG/DOSE, [place]) Inject 1 mg into the skin once a week. Wednesday     No current facility-administered medications for this visit.    Allergies:   Banana   Social History:  The patient  reports that she has never smoked. She has never used smokeless tobacco. She reports current alcohol use. She reports that she does not use drugs.   Family History:  The patient's family history includes Breast cancer  in her sister.  ROS:  Please see the history of present illness.    All other systems are reviewed and otherwise negative.   PHYSICAL EXAM:  VS:  BP 122/72   Pulse 62   Ht '5\' 5"'$  (1.651 m)   Wt 186 lb 12.8 oz (84.7 kg)   SpO2 96%   BMI 31.09 kg/m  BMI: Body mass index is 31.09 kg/m. Well nourished, well developed, in no acute distress HEENT: normocephalic, atraumatic Neck: no JVD, carotid bruits or masses Cardiac:   RRR; no significant murmurs, no rubs, or gallops Lungs:   CTA b/l, no wheezing, rhonchi or rales Abd: soft, nontender MS: no deformity or atrophy Ext: no edema Skin: warm and dry, no rash Neuro:  No gross deficits appreciated Psych: euthymic  mood, full affect    EKG:  Done today and reviewed by myself shows  SR 62bpm, stable intervals   08/06/2020: EPS CONCLUSIONS:  1. Sinus rhythm upon presentation.  2. Atrial tachycardia intermittently inducible at EP study but not sustained despite isuprel. 3. Earliest activation of the atrial tachycardia localizes to the parahisian region. 4. No early apparent complications  2/80/0349; TTE  1. Left ventricular ejection fraction, by estimation, is 50 to 55%. The  left ventricle has low normal function. The left ventricle has no regional  wall motion abnormalities. There is mild left ventricular hypertrophy.  Left ventricular diastolic  parameters are consistent with Grade I diastolic dysfunction (impaired  relaxation).   2. Right ventricular systolic function is normal. The right ventricular  size is normal. Tricuspid regurgitation signal is inadequate for assessing  PA pressure.   3. The mitral valve is normal in structure. Trivial mitral valve  regurgitation. No evidence of mitral stenosis.   4. The aortic valve is tricuspid. Aortic valve regurgitation is mild. No  aortic stenosis is present.   5. The inferior vena cava is normal in size with greater than 50%  respiratory variability, suggesting right atrial pressure of 3 mmHg.   Recent Labs: No results found for requested labs within last 365 days.  No results found for requested labs within last 365 days.   CrCl cannot be calculated (Patient's most recent lab result is older than the maximum 21 days allowed.).   Wt Readings from Last 3 Encounters:  05/20/22 186 lb 12.8 oz (84.7 kg)  03/31/21 178 lb (80.7 kg)  09/07/20 169 lb (76.7 kg)     Other studies reviewed: Additional studies/records reviewed today include: summarized above  ASSESSMENT AND PLAN:  SVT/ ATach Well controlled with on flecainide and metoprolol  stable intervals  Disposition: F/u with [place] in 34mo sooner if needed  Current medicines are reviewed  at length with the patient today.  The patient did not have any concerns regarding medicines.  SVenetia Night PA-C 05/20/2022 9:54 AM     [place] [phone])  [phone](fax)

## 2022-05-20 ENCOUNTER — Ambulatory Visit (INDEPENDENT_AMBULATORY_CARE_PROVIDER_SITE_OTHER): Payer: Medicare Other | Admitting: Physician Assistant

## 2022-05-20 ENCOUNTER — Other Ambulatory Visit: Payer: Self-pay

## 2022-05-20 ENCOUNTER — Encounter: Payer: Self-pay | Admitting: Physician Assistant

## 2022-05-20 VITALS — BP 122/72 | HR 62 | Ht 65.0 in | Wt 186.8 lb

## 2022-05-20 DIAGNOSIS — Z5181 Encounter for therapeutic drug level monitoring: Secondary | ICD-10-CM | POA: Diagnosis not present

## 2022-05-20 DIAGNOSIS — I471 Supraventricular tachycardia: Secondary | ICD-10-CM | POA: Diagnosis not present

## 2022-05-20 DIAGNOSIS — Z79899 Other long term (current) drug therapy: Secondary | ICD-10-CM | POA: Diagnosis not present

## 2022-05-20 MED ORDER — METOPROLOL SUCCINATE ER 25 MG PO TB24: 25.0000 mg | ORAL_TABLET | Freq: Every day | ORAL | 3 refills | Status: DC

## 2022-05-20 NOTE — Patient Instructions (Addendum)
Medication Instructions:   Your physician recommends that you continue on your current medications as directed. Please refer to the Current Medication list given to you today.   *If you need a refill on your cardiac medications before your next appointment, please call your pharmacy*   Lab Work: NONE ORDERED  TODAY     If you have labs (blood work) drawn today and your tests are completely normal, you will receive your results only by: MyChart Message (if you have MyChart) OR A paper copy in the mail If you have any lab test that is abnormal or we need to change your treatment, we will call you to review the results.   Testing/Procedures: NONE ORDERED  TODAY    Follow-Up: At [facility], you and your health needs are our priority.  As part of our continuing mission to provide you with exceptional heart care, we have created designated Provider Care Teams.  These Care Teams include your primary Cardiologist (physician) and Advanced Practice Providers (APPs -  Physician Assistants and Nurse Practitioners) who all work together to provide you with the care you need, when you need it.  We recommend signing up for the patient portal called "MyChart".  Sign up information is provided on this After Visit Summary.  MyChart is used to connect with patients for Virtual Visits (Telemedicine).  Patients are able to view lab/test results, encounter notes, upcoming appointments, etc.  Non-urgent messages can be sent to your provider as well.   To learn more about what you can do with MyChart, go to [email or web address].com.    Your next appointment:   6 month(s)  The format for your next appointment:   In Person  Provider:   Cameron Lambert, MD    Other Instructions  Important Information About Sugar       

## 2022-05-23 DIAGNOSIS — L409 Psoriasis, unspecified: Secondary | ICD-10-CM | POA: Diagnosis not present

## 2022-05-23 DIAGNOSIS — L259 Unspecified contact dermatitis, unspecified cause: Secondary | ICD-10-CM | POA: Diagnosis not present

## 2022-06-18 ENCOUNTER — Other Ambulatory Visit: Payer: Self-pay | Admitting: Student

## 2022-08-01 DIAGNOSIS — M25561 Pain in right knee: Secondary | ICD-10-CM | POA: Diagnosis not present

## 2022-08-10 DIAGNOSIS — S83231A Complex tear of medial meniscus, current injury, right knee, initial encounter: Secondary | ICD-10-CM | POA: Diagnosis not present

## 2022-08-10 DIAGNOSIS — M25561 Pain in right knee: Secondary | ICD-10-CM | POA: Diagnosis not present

## 2022-08-11 ENCOUNTER — Telehealth: Payer: Self-pay | Admitting: *Deleted

## 2022-08-11 NOTE — Telephone Encounter (Signed)
Pt agreeable to plan of care for tele pre op appt 08/17/22 @ 2:20. Procedure is 08/19/22.    Med rec and consent are done.

## 2022-08-11 NOTE — Telephone Encounter (Signed)
   Name: Sonya Steele  DOB: [birth year]  MRN: [id number]  Primary Cardiologist: None  Chart reviewed as part of pre-operative protocol coverage. Because of [name]'s past medical history and time since last visit, she will require a follow-up telephone visit in order to better assess preoperative cardiovascular risk.  Pre-op covering staff: - Please schedule appointment and call patient to inform them. If patient already had an upcoming appointment within acceptable timeframe, please add "pre-op clearance" to the appointment notes so provider is aware. - Please contact requesting surgeon's office via preferred method (i.e, phone, fax) to inform them of need for appointment prior to surgery.  No medications indicated as needing to be held.  Elgie Collard, PA-C  08/11/2022, 1:13 PM

## 2022-08-11 NOTE — Telephone Encounter (Signed)
   Pre-operative Risk Assessment    Patient Name: Sonya Steele  DOB: 1951/07/13 MRN: [id number]      Request for Surgical Clearance    Procedure:   RIGHT KNEE SCOPE PPM CHONDROPLASTY  SYNOVECTOMY   Date of Surgery:  Clearance 08/19/22                                 Surgeon:  DR. Legrand Como Steele Surgeon's Group or Practice Name:  [facility]  Phone number:  [phone] Fax number:  [phone] ATTN: GINA KOZEE   Type of Clearance Requested:   - Medical ; NO MEDICATIONS LISTED AS NEEDING TO BE HELD   Type of Anesthesia:  Not Indicated (GENERAL?)   Additional requests/questions:    Sonya Steele   08/11/2022, 12:47 PM

## 2022-08-11 NOTE — Telephone Encounter (Signed)
Pt agreeable to plan of care for tele pre op appt 08/17/22 @ 2:20. Procedure is 08/19/22.   Med rec and consent are done.     Patient Consent for Virtual Visit        Sonya Steele has provided verbal consent on 08/11/2022 for a virtual visit (video or telephone).   CONSENT FOR VIRTUAL VISIT FOR:  Sonya Steele  By participating in this virtual visit I agree to the following:  I hereby voluntarily request, consent and authorize [facility] and its employed or contracted physicians, physician assistants, nurse practitioners or other licensed health care professionals (the Practitioner), to provide me with telemedicine health care services (the "Services") as deemed necessary by the treating Practitioner. I acknowledge and consent to receive the Services by the Practitioner via telemedicine. I understand that the telemedicine visit will involve communicating with the Practitioner through live audiovisual communication technology and the disclosure of certain medical information by electronic transmission. I acknowledge that I have been given the opportunity to request an in-person assessment or other available alternative prior to the telemedicine visit and am voluntarily participating in the telemedicine visit.  I understand that I have the right to withhold or withdraw my consent to the use of telemedicine in the course of my care at any time, without affecting my right to future care or treatment, and that the Practitioner or I may terminate the telemedicine visit at any time. I understand that I have the right to inspect all information obtained and/or recorded in the course of the telemedicine visit and may receive copies of available information for a reasonable fee.  I understand that some of the potential risks of receiving the Services via telemedicine include:  Delay or interruption in medical evaluation due to technological equipment failure or disruption; Information  transmitted may not be sufficient (e.g. poor resolution of images) to allow for appropriate medical decision making by the Practitioner; and/or  In rare instances, security protocols could fail, causing a breach of personal health information.  Furthermore, I acknowledge that it is my responsibility to provide information about my medical history, conditions and care that is complete and accurate to the best of my ability. I acknowledge that Practitioner's advice, recommendations, and/or decision may be based on factors not within their control, such as incomplete or inaccurate data provided by me or distortions of diagnostic images or specimens that may result from electronic transmissions. I understand that the practice of medicine is not an exact science and that Practitioner makes no warranties or guarantees regarding treatment outcomes. I acknowledge that a copy of this consent can be made available to me via my patient portal ([facility]), or I can request a printed copy by calling the office of [facility].    I understand that my insurance will be billed for this visit.   I have read or had this consent read to me. I understand the contents of this consent, which adequately explains the benefits and risks of the Services being provided via telemedicine.  I have been provided ample opportunity to ask questions regarding this consent and the Services and have had my questions answered to my satisfaction. I give my informed consent for the services to be provided through the use of telemedicine in my medical care

## 2022-08-17 ENCOUNTER — Ambulatory Visit: Payer: Medicare Other | Attending: Cardiovascular Disease | Admitting: Nurse Practitioner

## 2022-08-17 DIAGNOSIS — Z01810 Encounter for preprocedural cardiovascular examination: Secondary | ICD-10-CM

## 2022-08-17 NOTE — Progress Notes (Signed)
Virtual Visit via Telephone Note   Because of Sonya Steele's co-morbid illnesses, she is at least at moderate risk for complications without adequate follow up.  This format is felt to be most appropriate for this patient at this time.  The patient did not have access to video technology/had technical difficulties with video requiring transitioning to audio format only (telephone).  All issues noted in this document were discussed and addressed.  No physical exam could be performed with this format.  Please refer to the patient's chart for her consent to telehealth for [facility].  Evaluation Performed:  Preoperative cardiovascular risk assessment _____________   Date:  08/17/2022   Patient ID:  Sonya Steele, Sonya Steele 09-23-1951, MRN [id number] Patient Location:  Home Provider location:   Office  Primary Care Provider:  Velna Hatchet, MD Primary Cardiologist:  None  Chief Complaint / Patient Profile   71 y.o. y/o female with a h/o SVT, hypothyroidism s/p partial thyroidectomy, who is pending knee scope PPM chondroplasty synovectomy on 08/19/2022 with Dr. Waynetta Sandy of [facility] and presents today for telephonic preoperative cardiovascular risk assessment.  Past Medical History    Past Medical History:  Diagnosis Date   History of thyroid cancer    Hypothyroidism    Psoriasis    Past Surgical History:  Procedure Laterality Date   APPENDECTOMY     CESAREAN SECTION     SVT ABLATION N/A 08/06/2020   Procedure: SVT ABLATION;  Surgeon: Vickie Epley, MD;  Location: [facility];  Service: Cardiovascular;  Laterality: N/A;   THYROIDECTOMY, PARTIAL     TONSILLECTOMY      Allergies  Allergies  Allergen Reactions   Banana Swelling    Throat closed    History of Present Illness    Sonya Steele is a 71 y.o. female who presents via audio/video conferencing for a telehealth visit today.  Pt was last seen in cardiology clinic on  05/20/2022 by Tommye Standard, PA.  At that time Sonya Steele was doing well. The patient is now pending procedure as outlined above. Since her last visit, she has done well from a cardiac standpoint. She denies chest pain, palpitations, dyspnea, pnd, orthopnea, n, v, dizziness, syncope, edema, weight gain, or early satiety. All other systems reviewed and are otherwise negative except as noted above.   Home Medications    Prior to Admission medications   Medication Sig Start Date End Date Taking? Authorizing Provider  Barberry-Oreg Grape-Goldenseal (BERBERINE COMPLEX PO) Take 500 mg by mouth daily.    [provider]  Cholecalciferol (VITAMIN D) 125 MCG (5000 UT) CAPS Take 5,000 Units by mouth daily.     [provider]  clobetasol (TEMOVATE) 0.05 % external solution Apply 1 application topically 2 (two) times daily.    [provider]  flecainide (TAMBOCOR) 50 MG tablet TAKE 1 TABLET BY MOUTH TWICE A DAY 06/20/22   Vickie Epley, MD  Fluocinolone Acetonide Scalp 0.01 % OIL Apply topically 3 (three) times a week. 01/04/21   [provider]  levothyroxine (SYNTHROID) 100 MCG tablet Take 100 mcg by mouth at bedtime.    [provider]  MAGNESIUM CITRATE PO Take 500 mg by mouth daily.    [provider]  meloxicam (MOBIC) 15 MG tablet Take 15 mg by mouth daily.    [provider]  metoprolol succinate (TOPROL-XL) 25 MG 24 hr tablet Take 1 tablet (25 mg total) by mouth daily. 05/20/22  Vickie Epley, MD  Multiple Vitamin (MULTIVITAMIN WITH MINERALS) TABS tablet Take 1 tablet by mouth daily. Centrum Silver    [provider]  Risankizumab-rzaa 150 MG/ML SOSY Inject 150 mg into the skin See admin instructions. Every 12 weeks    [provider]  Semaglutide (OZEMPIC, 1 MG/DOSE, [place]) Inject 1 mg into the skin once a week. Wednesday    [provider]    Physical Exam    Vital Signs:  Sonya Steele does  not have vital signs available for review today.  Given telephonic nature of communication, physical exam is limited. AAOx3. NAD. Normal affect.  Speech and respirations are unlabored.  Accessory Clinical Findings    None  Assessment & Plan    1.  Preoperative Cardiovascular Risk Assessment:  According to the Revised Cardiac Risk Index (RCRI), her Perioperative Risk of Major Cardiac Event is (%): 0.4. Her Functional Capacity in METs is: 7.99 according to the Duke Activity Status Index (DASI).Therefore, based on ACC/AHA guidelines, patient would be at acceptable risk for the planned procedure without further cardiovascular testing. The patient was advised that if she develops new symptoms prior to surgery to contact our office to arrange for a follow-up visit, and she verbalized understanding.  A copy of this note will be routed to requesting surgeon.  Time:   Today, I have spent 4 minutes with the patient with telehealth technology discussing medical history, symptoms, and management plan.     Sonya Sciara, NP  08/17/2022, 2:34 PM

## 2022-08-19 DIAGNOSIS — M23331 Other meniscus derangements, other medial meniscus, right knee: Secondary | ICD-10-CM | POA: Diagnosis not present

## 2022-08-19 DIAGNOSIS — M94261 Chondromalacia, right knee: Secondary | ICD-10-CM | POA: Diagnosis not present

## 2022-08-19 DIAGNOSIS — M948X6 Other specified disorders of cartilage, lower leg: Secondary | ICD-10-CM | POA: Diagnosis not present

## 2022-08-19 DIAGNOSIS — G8918 Other acute postprocedural pain: Secondary | ICD-10-CM | POA: Diagnosis not present

## 2022-08-19 DIAGNOSIS — S83241A Other tear of medial meniscus, current injury, right knee, initial encounter: Secondary | ICD-10-CM | POA: Diagnosis not present

## 2022-08-19 DIAGNOSIS — M659 Synovitis and tenosynovitis, unspecified: Secondary | ICD-10-CM | POA: Diagnosis not present

## 2022-08-19 DIAGNOSIS — M2241 Chondromalacia patellae, right knee: Secondary | ICD-10-CM | POA: Diagnosis not present

## 2022-08-19 DIAGNOSIS — M65861 Other synovitis and tenosynovitis, right lower leg: Secondary | ICD-10-CM | POA: Diagnosis not present

## 2022-08-26 DIAGNOSIS — L409 Psoriasis, unspecified: Secondary | ICD-10-CM | POA: Diagnosis not present

## 2022-08-26 DIAGNOSIS — L259 Unspecified contact dermatitis, unspecified cause: Secondary | ICD-10-CM | POA: Diagnosis not present

## 2022-08-26 DIAGNOSIS — L0889 Other specified local infections of the skin and subcutaneous tissue: Secondary | ICD-10-CM | POA: Diagnosis not present

## 2022-08-26 DIAGNOSIS — Z79899 Other long term (current) drug therapy: Secondary | ICD-10-CM | POA: Diagnosis not present

## 2022-09-10 DIAGNOSIS — Z23 Encounter for immunization: Secondary | ICD-10-CM | POA: Diagnosis not present

## 2022-09-16 DIAGNOSIS — H2511 Age-related nuclear cataract, right eye: Secondary | ICD-10-CM | POA: Diagnosis not present

## 2022-09-16 DIAGNOSIS — H2512 Age-related nuclear cataract, left eye: Secondary | ICD-10-CM | POA: Diagnosis not present

## 2022-09-16 DIAGNOSIS — D3131 Benign neoplasm of right choroid: Secondary | ICD-10-CM | POA: Diagnosis not present

## 2022-11-11 DIAGNOSIS — Z5181 Encounter for therapeutic drug level monitoring: Secondary | ICD-10-CM | POA: Diagnosis not present

## 2022-11-11 DIAGNOSIS — L089 Local infection of the skin and subcutaneous tissue, unspecified: Secondary | ICD-10-CM | POA: Diagnosis not present

## 2022-11-11 DIAGNOSIS — L409 Psoriasis, unspecified: Secondary | ICD-10-CM | POA: Diagnosis not present

## 2022-11-11 DIAGNOSIS — L821 Other seborrheic keratosis: Secondary | ICD-10-CM | POA: Diagnosis not present

## 2022-11-14 DIAGNOSIS — M25552 Pain in left hip: Secondary | ICD-10-CM | POA: Diagnosis not present

## 2022-11-17 NOTE — Progress Notes (Addendum)
Cardiology Office Note Date:  11/18/2022  Patient ID:  Analycia, Steele [birth year], MRN [id number] PCP:  Velna Hatchet, MD  Cardiologist:  None Electrophysiologist: Vickie Epley, MD    Chief Complaint: 80mo routine flecainide follow-up  History of Present Illness: Sonya MAPELis a 71y.o. female seen today for CVickie Epley MD for routine electrophysiology followup.   She is s/p EPS 09/2020: Her atrial tachycardia was difficult to map given difficulties inducing the arrhythmia and its brief salvos in the lab. The area of earliest activation mapped to the area surrounding the His bundle. The procedure was stopped and she was started on flecainide   Saw PA Tillery 03/2021, self-reduced flecainide to daily dosing d/t constipation. Dose adjsuted to '50mg'$  BID.  Saw PA UCharlcie Cradle6/2023. Doing well, brief palpitations when misses flecainide.   R Knee surgery 08/2022  Since last being seen in our clinic the patient reports doing well from a cardio-standpoint.  She is having L hip pain, following with ortho for that.  She is taking flecainide '100mg'$  daily instead of '50mg'$  BID. Taking metop daily. She questions whether she will be on medication life-long or whether she can stop. Thinks that her AT/SVT was triggered by covid vaccine + steroid injection.     she denies chest pain, palpitations, dyspnea, PND, orthopnea, nausea, vomiting, dizziness, syncope, edema, weight gain, or early satiety.     AAD history: Atach/SVT: Flecainide 2021, dose reduced 2/2 constipation 03/2021  Past Medical History:  Diagnosis Date   History of thyroid cancer    Hypothyroidism    Psoriasis     Past Surgical History:  Procedure Laterality Date   APPENDECTOMY     CESAREAN SECTION     SVT ABLATION N/A 08/06/2020   Procedure: SVT ABLATION;  Surgeon: LVickie Epley MD;  Location[facility];  Service: Cardiovascular;  Laterality: N/A;   THYROIDECTOMY, PARTIAL     TONSILLECTOMY       Current Outpatient Medications  Medication Sig Dispense Refill   Barberry-Oreg Grape-Goldenseal (BERBERINE COMPLEX PO) Take 500 mg by mouth daily.     Cholecalciferol (VITAMIN D) 125 MCG (5000 UT) CAPS Take 5,000 Units by mouth daily.      clobetasol (TEMOVATE) 0.05 % external solution Apply 1 application topically 2 (two) times daily.     Fluocinolone Acetonide Scalp 0.01 % OIL Apply topically 3 (three) times a week.     levothyroxine (SYNTHROID) 100 MCG tablet Take 100 mcg by mouth at bedtime.     MAGNESIUM CITRATE PO Take 500 mg by mouth daily.     meloxicam (MOBIC) 15 MG tablet Take 15 mg by mouth daily.     metoprolol succinate (TOPROL-XL) 25 MG 24 hr tablet Take 1 tablet (25 mg total) by mouth daily. 90 tablet 3   Multiple Vitamin (MULTIVITAMIN WITH MINERALS) TABS tablet Take 1 tablet by mouth daily. Centrum Silver     Risankizumab-rzaa 150 MG/ML SOSY Inject 150 mg into the skin See admin instructions. Every 12 weeks     Semaglutide (OZEMPIC, 1 MG/DOSE, [place]) Inject 1 mg into the skin once a week. Wednesday     flecainide (TAMBOCOR) 50 MG tablet Take 1 tablet (50 mg total) by mouth 2 (two) times daily. 60 tablet 11   No current facility-administered medications for this visit.    Allergies:   Banana   Social History:  The patient  reports that she has never smoked. She has never used smokeless tobacco.  She reports current alcohol use. She reports that she does not use drugs.   Family History:  The patient's family history includes Breast cancer in her sister.  ROS:  Please see the history of present illness. All other systems are reviewed and otherwise negative.   PHYSICAL EXAM:  Vitals:   11/18/22 1116  BP: 132/84  Pulse: 62  SpO2: 97%  Weight: 189 lb (85.7 kg)  Height: '5\' 5"'$  (1.651 m)    GEN- The patient is well appearing, alert and oriented x 3 today.   HEENT: normocephalic, atraumatic; sclera clear, conjunctiva pink; hearing intact; oropharynx clear; neck supple, no  JVP Lungs- Clear to ausculation bilaterally, normal work of breathing.  No wheezes, rales, rhonchi Heart- Regular rate and rhythm, no murmurs, rubs or gallops, PMI not laterally displaced GI- soft, non-tender, non-distended, bowel sounds present, no hepatosplenomegaly Extremities- No peripheral edema. no clubbing or cyanosis; DP/PT/radial pulses 2+ bilaterally MS- no significant deformity or atrophy Skin- warm and dry, no rash or lesion Psych- euthymic mood, full affect Neuro- strength and sensation are intact    EKG is ordered. Personal review of EKG from today shows  NSR, rate 62bpm; PR interval 215m, narrow QRS.  Recent Labs: No results found for requested labs within last 365 days.  No results found for requested labs within last 365 days.   CrCl cannot be calculated (Patient's most recent lab result is older than the maximum 21 days allowed.).   Wt Readings from Last 3 Encounters:  11/18/22 189 lb (85.7 kg)  05/20/22 186 lb 12.8 oz (84.7 kg)  03/31/21 178 lb (80.7 kg)     Additional studies reviewed include: Previous EP notes.   Echo 07/27/2020  1. Left ventricular ejection fraction, by estimation, is 50 to 55%. The left ventricle has low normal function. The left ventricle has no regional wall motion abnormalities. There is mild left ventricular hypertrophy. Left ventricular diastolic  parameters are consistent with Grade I diastolic dysfunction (impaired relaxation).   2. Right ventricular systolic function is normal. The right ventricular size is normal. Tricuspid regurgitation signal is inadequate for assessing PA pressure.   3. The mitral valve is normal in structure. Trivial mitral valve regurgitation. No evidence of mitral stenosis.   4. The aortic valve is tricuspid. Aortic valve regurgitation is mild. No aortic stenosis is present.   5. The inferior vena cava is normal in size with greater than 50% respiratory variability, suggesting right atrial pressure of 3 mmHg.    Zio 08/06/2020 Sinus rhythm with normal heart rate variability. There are episodes of narrow complex tachycardia--130s to the 180s. Longest episode [id number] PVC and NSVT.   ASSESSMENT AND PLAN:  #) SVT  Atach no symptoms/burden Encouraged her to take flecainide twice a day instead of once a day  stable EKG intervals We discussed possibly stopping flecainide at some point in the future. Given joint issues, will continue meds for now and reassess at later date.   Current medicines are reviewed at length with the patient today.   The patient does not have concerns regarding her medicines.  The following changes were made today:  none  Labs/ tests ordered today include:  Orders Placed This Encounter  Procedures   EKG 12-Lead     Disposition: Follow up with Dr. LaQuentin Orer APP in 6 months   Signed, SuMamie LeversNP  11/18/22 11:43 AM   [place] [phone])  [phone]  fax)

## 2022-11-18 ENCOUNTER — Ambulatory Visit: Payer: Medicare Other | Attending: Physician Assistant | Admitting: Cardiology

## 2022-11-18 ENCOUNTER — Encounter: Payer: Self-pay | Admitting: Physician Assistant

## 2022-11-18 VITALS — BP 132/84 | HR 62 | Ht 65.0 in | Wt 189.0 lb

## 2022-11-18 DIAGNOSIS — Z79899 Other long term (current) drug therapy: Secondary | ICD-10-CM | POA: Diagnosis not present

## 2022-11-18 DIAGNOSIS — Z5181 Encounter for therapeutic drug level monitoring: Secondary | ICD-10-CM

## 2022-11-18 DIAGNOSIS — I471 Supraventricular tachycardia, unspecified: Secondary | ICD-10-CM

## 2022-11-18 DIAGNOSIS — I4719 Other supraventricular tachycardia: Secondary | ICD-10-CM

## 2022-11-18 MED ORDER — METOPROLOL SUCCINATE ER 25 MG PO TB24: 25.0000 mg | ORAL_TABLET | Freq: Every day | ORAL | 1 refills | Status: DC

## 2022-11-18 MED ORDER — FLECAINIDE ACETATE 50 MG PO TABS: 50.0000 mg | ORAL_TABLET | Freq: Two times a day (BID) | ORAL | 11 refills | Status: DC

## 2022-11-18 MED ORDER — FLECAINIDE ACETATE 50 MG PO TABS: 50.0000 mg | ORAL_TABLET | Freq: Two times a day (BID) | ORAL | 1 refills | Status: DC

## 2022-11-18 NOTE — Addendum Note (Signed)
Addended by: Mamie Levers on: 11/18/2022 11:51 AM   Modules accepted: Orders

## 2022-11-18 NOTE — Patient Instructions (Signed)
Medication Instructions:   Your physician recommends that you continue on your current medications as directed. Please refer to the Current Medication list given to you today.  *If you need a refill on your cardiac medications before your next appointment, please call your pharmacy*   Lab Work: [place]    If you have labs (blood work) drawn today and your tests are completely normal, you will receive your results only by: [place] (if you have MyChart) OR A paper copy in the mail If you have any lab test that is abnormal or we need to change your treatment, we will call you to review the results.   Testing/Procedures: NONE ORDERED  TODAY    Follow-Up: At [facility], you and your health needs are our priority.  As part of our continuing mission to provide you with exceptional heart care, we have created designated Provider Care Teams.  These Care Teams include your primary Cardiologist (physician) and Advanced Practice Providers (APPs -  Physician Assistants and Nurse Practitioners) who all work together to provide you with the care you need, when you need it.  We recommend signing up for the patient portal called "MyChart".  Sign up information is provided on this After Visit Summary.  MyChart is used to connect with patients for Virtual Visits (Telemedicine).  Patients are able to view lab/test results, encounter notes, upcoming appointments, etc.  Non-urgent messages can be sent to your provider as well.   To learn more about what you can do with MyChart, go to [email or web address].    Your next appointment:   6 month(s)  The format for your next appointment:   In Person  Provider:   You may see Vickie Epley, MD or one of the following Advanced Practice Providers on your designated Care Team:   Tommye Standard, [place] Legrand Como "Jonni Sanger" Chalmers Cater, [place]  Other Instructions  Important Information About Sugar

## 2022-12-07 DIAGNOSIS — M1612 Unilateral primary osteoarthritis, left hip: Secondary | ICD-10-CM | POA: Diagnosis not present

## 2022-12-16 DIAGNOSIS — M1612 Unilateral primary osteoarthritis, left hip: Secondary | ICD-10-CM | POA: Diagnosis not present

## 2022-12-16 DIAGNOSIS — S72002A Fracture of unspecified part of neck of left femur, initial encounter for closed fracture: Secondary | ICD-10-CM | POA: Diagnosis not present

## 2022-12-21 ENCOUNTER — Other Ambulatory Visit (HOSPITAL_BASED_OUTPATIENT_CLINIC_OR_DEPARTMENT_OTHER): Payer: Self-pay | Admitting: Internal Medicine

## 2022-12-21 DIAGNOSIS — N289 Disorder of kidney and ureter, unspecified: Secondary | ICD-10-CM

## 2022-12-23 ENCOUNTER — Ambulatory Visit (HOSPITAL_BASED_OUTPATIENT_CLINIC_OR_DEPARTMENT_OTHER)
Admission: RE | Admit: 2022-12-23 | Discharge: 2022-12-23 | Disposition: A | Discharge status: Home / Self Care | Payer: Medicare Other | Source: Ambulatory Visit | Attending: Internal Medicine | Admitting: Internal Medicine

## 2022-12-23 DIAGNOSIS — N289 Disorder of kidney and ureter, unspecified: Secondary | ICD-10-CM | POA: Diagnosis not present

## 2022-12-23 DIAGNOSIS — E1169 Type 2 diabetes mellitus with other specified complication: Secondary | ICD-10-CM | POA: Diagnosis not present

## 2022-12-23 DIAGNOSIS — N200 Calculus of kidney: Secondary | ICD-10-CM | POA: Diagnosis not present

## 2022-12-23 DIAGNOSIS — Z23 Encounter for immunization: Secondary | ICD-10-CM | POA: Diagnosis not present

## 2022-12-23 DIAGNOSIS — K760 Fatty (change of) liver, not elsewhere classified: Secondary | ICD-10-CM | POA: Diagnosis not present

## 2022-12-26 ENCOUNTER — Other Ambulatory Visit (HOSPITAL_BASED_OUTPATIENT_CLINIC_OR_DEPARTMENT_OTHER): Payer: Self-pay | Admitting: Internal Medicine

## 2022-12-26 DIAGNOSIS — N289 Disorder of kidney and ureter, unspecified: Secondary | ICD-10-CM

## 2022-12-30 ENCOUNTER — Ambulatory Visit (HOSPITAL_BASED_OUTPATIENT_CLINIC_OR_DEPARTMENT_OTHER)
Admission: RE | Admit: 2022-12-30 | Discharge: 2022-12-30 | Disposition: A | Discharge status: Home / Self Care | Payer: Medicare Other | Source: Ambulatory Visit | Attending: Internal Medicine | Admitting: Internal Medicine

## 2022-12-30 DIAGNOSIS — K828 Other specified diseases of gallbladder: Secondary | ICD-10-CM | POA: Insufficient documentation

## 2022-12-30 DIAGNOSIS — K760 Fatty (change of) liver, not elsewhere classified: Secondary | ICD-10-CM | POA: Diagnosis not present

## 2022-12-30 DIAGNOSIS — N289 Disorder of kidney and ureter, unspecified: Secondary | ICD-10-CM | POA: Insufficient documentation

## 2022-12-30 DIAGNOSIS — N281 Cyst of kidney, acquired: Secondary | ICD-10-CM | POA: Insufficient documentation

## 2022-12-30 MED ORDER — GADOBUTROL 1 MMOL/ML IV SOLN
8.5000 mL | Freq: Once | INTRAVENOUS | Status: AC | PRN
  Administered 2022-12-30: 8.5 mL via INTRAVENOUS
  Filled 2022-12-30: qty 10

## 2023-01-03 DIAGNOSIS — M1612 Unilateral primary osteoarthritis, left hip: Secondary | ICD-10-CM | POA: Diagnosis not present

## 2023-01-03 DIAGNOSIS — Z85850 Personal history of malignant neoplasm of thyroid: Secondary | ICD-10-CM | POA: Diagnosis not present

## 2023-01-03 DIAGNOSIS — I4719 Other supraventricular tachycardia: Secondary | ICD-10-CM | POA: Diagnosis not present

## 2023-01-03 DIAGNOSIS — Z01812 Encounter for preprocedural laboratory examination: Secondary | ICD-10-CM | POA: Diagnosis not present

## 2023-01-03 DIAGNOSIS — L409 Psoriasis, unspecified: Secondary | ICD-10-CM | POA: Diagnosis not present

## 2023-01-03 DIAGNOSIS — E890 Postprocedural hypothyroidism: Secondary | ICD-10-CM | POA: Diagnosis not present

## 2023-01-03 DIAGNOSIS — K7581 Nonalcoholic steatohepatitis (NASH): Secondary | ICD-10-CM | POA: Diagnosis not present

## 2023-01-03 DIAGNOSIS — Z7984 Long term (current) use of oral hypoglycemic drugs: Secondary | ICD-10-CM | POA: Diagnosis not present

## 2023-01-03 DIAGNOSIS — I471 Supraventricular tachycardia, unspecified: Secondary | ICD-10-CM | POA: Diagnosis not present

## 2023-01-03 DIAGNOSIS — E119 Type 2 diabetes mellitus without complications: Secondary | ICD-10-CM | POA: Diagnosis not present

## 2023-01-03 DIAGNOSIS — Z01818 Encounter for other preprocedural examination: Secondary | ICD-10-CM | POA: Diagnosis not present

## 2023-01-17 DIAGNOSIS — Z79899 Other long term (current) drug therapy: Secondary | ICD-10-CM | POA: Diagnosis not present

## 2023-01-17 DIAGNOSIS — Z96642 Presence of left artificial hip joint: Secondary | ICD-10-CM | POA: Diagnosis not present

## 2023-01-17 DIAGNOSIS — M1612 Unilateral primary osteoarthritis, left hip: Secondary | ICD-10-CM | POA: Diagnosis not present

## 2023-01-17 DIAGNOSIS — Z7985 Long-term (current) use of injectable non-insulin antidiabetic drugs: Secondary | ICD-10-CM | POA: Diagnosis not present

## 2023-01-17 DIAGNOSIS — Z9049 Acquired absence of other specified parts of digestive tract: Secondary | ICD-10-CM | POA: Diagnosis not present

## 2023-01-17 DIAGNOSIS — Z471 Aftercare following joint replacement surgery: Secondary | ICD-10-CM | POA: Diagnosis not present

## 2023-01-17 DIAGNOSIS — E119 Type 2 diabetes mellitus without complications: Secondary | ICD-10-CM | POA: Diagnosis not present

## 2023-01-17 DIAGNOSIS — Z6831 Body mass index (BMI) 31.0-31.9, adult: Secondary | ICD-10-CM | POA: Diagnosis not present

## 2023-01-17 DIAGNOSIS — E669 Obesity, unspecified: Secondary | ICD-10-CM | POA: Diagnosis not present

## 2023-01-17 DIAGNOSIS — E039 Hypothyroidism, unspecified: Secondary | ICD-10-CM | POA: Diagnosis not present

## 2023-01-17 DIAGNOSIS — G4733 Obstructive sleep apnea (adult) (pediatric): Secondary | ICD-10-CM | POA: Diagnosis not present

## 2023-01-18 DIAGNOSIS — E039 Hypothyroidism, unspecified: Secondary | ICD-10-CM | POA: Diagnosis not present

## 2023-01-18 DIAGNOSIS — Z9049 Acquired absence of other specified parts of digestive tract: Secondary | ICD-10-CM | POA: Diagnosis not present

## 2023-01-18 DIAGNOSIS — G4733 Obstructive sleep apnea (adult) (pediatric): Secondary | ICD-10-CM | POA: Diagnosis not present

## 2023-01-18 DIAGNOSIS — Z7985 Long-term (current) use of injectable non-insulin antidiabetic drugs: Secondary | ICD-10-CM | POA: Diagnosis not present

## 2023-01-18 DIAGNOSIS — M1612 Unilateral primary osteoarthritis, left hip: Secondary | ICD-10-CM | POA: Diagnosis not present

## 2023-01-18 DIAGNOSIS — Z6831 Body mass index (BMI) 31.0-31.9, adult: Secondary | ICD-10-CM | POA: Diagnosis not present

## 2023-01-18 DIAGNOSIS — E119 Type 2 diabetes mellitus without complications: Secondary | ICD-10-CM | POA: Diagnosis not present

## 2023-01-18 DIAGNOSIS — E669 Obesity, unspecified: Secondary | ICD-10-CM | POA: Diagnosis not present

## 2023-01-18 DIAGNOSIS — Z79899 Other long term (current) drug therapy: Secondary | ICD-10-CM | POA: Diagnosis not present

## 2023-01-19 DIAGNOSIS — Z791 Long term (current) use of non-steroidal anti-inflammatories (NSAID): Secondary | ICD-10-CM | POA: Diagnosis not present

## 2023-01-19 DIAGNOSIS — Z471 Aftercare following joint replacement surgery: Secondary | ICD-10-CM | POA: Diagnosis not present

## 2023-01-19 DIAGNOSIS — Z96642 Presence of left artificial hip joint: Secondary | ICD-10-CM | POA: Diagnosis not present

## 2023-01-19 DIAGNOSIS — Z7985 Long-term (current) use of injectable non-insulin antidiabetic drugs: Secondary | ICD-10-CM | POA: Diagnosis not present

## 2023-01-19 DIAGNOSIS — Z9181 History of falling: Secondary | ICD-10-CM | POA: Diagnosis not present

## 2023-01-19 DIAGNOSIS — E079 Disorder of thyroid, unspecified: Secondary | ICD-10-CM | POA: Diagnosis not present

## 2023-01-19 DIAGNOSIS — M1990 Unspecified osteoarthritis, unspecified site: Secondary | ICD-10-CM | POA: Diagnosis not present

## 2023-01-19 DIAGNOSIS — Z7982 Long term (current) use of aspirin: Secondary | ICD-10-CM | POA: Diagnosis not present

## 2023-01-19 DIAGNOSIS — E119 Type 2 diabetes mellitus without complications: Secondary | ICD-10-CM | POA: Diagnosis not present

## 2023-01-19 DIAGNOSIS — Z79899 Other long term (current) drug therapy: Secondary | ICD-10-CM | POA: Diagnosis not present

## 2023-01-19 DIAGNOSIS — Z79891 Long term (current) use of opiate analgesic: Secondary | ICD-10-CM | POA: Diagnosis not present

## 2023-01-19 DIAGNOSIS — K7581 Nonalcoholic steatohepatitis (NASH): Secondary | ICD-10-CM | POA: Diagnosis not present

## 2023-01-19 DIAGNOSIS — L409 Psoriasis, unspecified: Secondary | ICD-10-CM | POA: Diagnosis not present

## 2023-01-19 DIAGNOSIS — G4733 Obstructive sleep apnea (adult) (pediatric): Secondary | ICD-10-CM | POA: Diagnosis not present

## 2023-01-26 DIAGNOSIS — Z79899 Other long term (current) drug therapy: Secondary | ICD-10-CM | POA: Diagnosis not present

## 2023-01-26 DIAGNOSIS — Z79891 Long term (current) use of opiate analgesic: Secondary | ICD-10-CM | POA: Diagnosis not present

## 2023-01-26 DIAGNOSIS — Z96642 Presence of left artificial hip joint: Secondary | ICD-10-CM | POA: Diagnosis not present

## 2023-01-26 DIAGNOSIS — Z791 Long term (current) use of non-steroidal anti-inflammatories (NSAID): Secondary | ICD-10-CM | POA: Diagnosis not present

## 2023-01-26 DIAGNOSIS — Z471 Aftercare following joint replacement surgery: Secondary | ICD-10-CM | POA: Diagnosis not present

## 2023-01-26 DIAGNOSIS — E079 Disorder of thyroid, unspecified: Secondary | ICD-10-CM | POA: Diagnosis not present

## 2023-01-26 DIAGNOSIS — G4733 Obstructive sleep apnea (adult) (pediatric): Secondary | ICD-10-CM | POA: Diagnosis not present

## 2023-01-26 DIAGNOSIS — L409 Psoriasis, unspecified: Secondary | ICD-10-CM | POA: Diagnosis not present

## 2023-01-26 DIAGNOSIS — Z7985 Long-term (current) use of injectable non-insulin antidiabetic drugs: Secondary | ICD-10-CM | POA: Diagnosis not present

## 2023-01-26 DIAGNOSIS — Z7982 Long term (current) use of aspirin: Secondary | ICD-10-CM | POA: Diagnosis not present

## 2023-01-26 DIAGNOSIS — R197 Diarrhea, unspecified: Secondary | ICD-10-CM | POA: Diagnosis not present

## 2023-01-26 DIAGNOSIS — M1990 Unspecified osteoarthritis, unspecified site: Secondary | ICD-10-CM | POA: Diagnosis not present

## 2023-01-26 DIAGNOSIS — Z9181 History of falling: Secondary | ICD-10-CM | POA: Diagnosis not present

## 2023-01-26 DIAGNOSIS — K7581 Nonalcoholic steatohepatitis (NASH): Secondary | ICD-10-CM | POA: Diagnosis not present

## 2023-01-26 DIAGNOSIS — E119 Type 2 diabetes mellitus without complications: Secondary | ICD-10-CM | POA: Diagnosis not present

## 2023-02-13 DIAGNOSIS — M25552 Pain in left hip: Secondary | ICD-10-CM | POA: Diagnosis not present

## 2023-02-14 DIAGNOSIS — R195 Other fecal abnormalities: Secondary | ICD-10-CM | POA: Diagnosis not present

## 2023-02-14 DIAGNOSIS — E1169 Type 2 diabetes mellitus with other specified complication: Secondary | ICD-10-CM | POA: Diagnosis not present

## 2023-02-14 DIAGNOSIS — D849 Immunodeficiency, unspecified: Secondary | ICD-10-CM | POA: Diagnosis not present

## 2023-02-14 DIAGNOSIS — E876 Hypokalemia: Secondary | ICD-10-CM | POA: Diagnosis not present

## 2023-04-17 DIAGNOSIS — M25552 Pain in left hip: Secondary | ICD-10-CM | POA: Diagnosis not present

## 2023-05-08 DIAGNOSIS — E785 Hyperlipidemia, unspecified: Secondary | ICD-10-CM | POA: Diagnosis not present

## 2023-05-08 DIAGNOSIS — E1169 Type 2 diabetes mellitus with other specified complication: Secondary | ICD-10-CM | POA: Diagnosis not present

## 2023-05-08 DIAGNOSIS — R7989 Other specified abnormal findings of blood chemistry: Secondary | ICD-10-CM | POA: Diagnosis not present

## 2023-05-08 DIAGNOSIS — E039 Hypothyroidism, unspecified: Secondary | ICD-10-CM | POA: Diagnosis not present

## 2023-05-15 DIAGNOSIS — Z1331 Encounter for screening for depression: Secondary | ICD-10-CM | POA: Diagnosis not present

## 2023-05-15 DIAGNOSIS — Z780 Asymptomatic menopausal state: Secondary | ICD-10-CM | POA: Diagnosis not present

## 2023-05-15 DIAGNOSIS — Z0000 Encounter for general adult medical examination without abnormal findings: Secondary | ICD-10-CM | POA: Diagnosis not present

## 2023-05-15 DIAGNOSIS — E1169 Type 2 diabetes mellitus with other specified complication: Secondary | ICD-10-CM | POA: Diagnosis not present

## 2023-05-15 DIAGNOSIS — E1165 Type 2 diabetes mellitus with hyperglycemia: Secondary | ICD-10-CM | POA: Diagnosis not present

## 2023-05-15 DIAGNOSIS — Z1339 Encounter for screening examination for other mental health and behavioral disorders: Secondary | ICD-10-CM | POA: Diagnosis not present

## 2023-05-15 NOTE — Progress Notes (Unsigned)
  Electrophysiology Office Note:   Date:  05/17/2023  ID:  KASARA SCHOMER, DOB 1951-08-28, MRN [id number]  Primary Cardiologist: None Electrophysiologist: Lanier Prude, MD   History of Present Illness:   Sonya Steele is a 72 y.o. female with h/o SVT and atrial tachycardia seen today for routine electrophysiology followup. Since last being seen in our clinic the patient reports doing well.  She has been taking flecainide 50 mg BID. Reports she will only "notice her heart" if she forgets to take the evening dose of flecainide. In the last 4 months she has been very busy caring for her husband who was acutely ill requiring hospitalization. She denies chest pain, palpitations, dyspnea, PND, orthopnea, nausea, vomiting, dizziness, syncope, edema, weight gain, or early satiety.   Review of systems complete and found to be negative unless listed in HPI.   Studies Reviewed:    EKG is ordered today. Personal review shows SB, rate 58, no ectopy  EPS 09/2020 > atrial tachycardia difficult to map given difficulties inducing the arrythmia and its brief salvos in the lab. Area of earliest activation mapped to the area surrounding the His Bundle. Procedure stopped, & flecainide initiated.  ECHO 07/27/2020 > LVEF 50-55%, no RWMA, G1DD, RVSP normal,    Physical Exam:   VS:  BP 130/80   Pulse (!) 56   Ht 5\' 5"  (1.651 m)   Wt 181 lb 6.4 oz (82.3 kg)   SpO2 99%   BMI 30.19 kg/m    Wt Readings from Last 3 Encounters:  05/17/23 181 lb 6.4 oz (82.3 kg)  11/18/22 189 lb (85.7 kg)  05/20/22 186 lb 12.8 oz (84.7 kg)     GEN: Well nourished, well developed in no acute distress NECK: No JVD; No carotid bruits CARDIAC: Regular rate and rhythm, no murmurs, rubs, gallops RESPIRATORY:  Clear to auscultation without rales, wheezing or rhonchi  ABDOMEN: Soft, non-tender, non-distended EXTREMITIES:  No edema; No deformity   ASSESSMENT AND PLAN:    SVT Atrial tach EKG today shows SB 58, no ectopy, on  flecainide 50mg  BID  Continue toprol 25 mg daily Annual labs monitored by her PCP, requested from primary Minimal palpitation burden     Follow up with Dr. Lalla Brothers in 6 months  Signed, Canary Brim, MSN, APRN, NP-C, AGACNP-BC [facility] - Electrophysiology  05/17/2023, 10:54 AM

## 2023-05-16 DIAGNOSIS — L409 Psoriasis, unspecified: Secondary | ICD-10-CM | POA: Diagnosis not present

## 2023-05-16 DIAGNOSIS — Z5181 Encounter for therapeutic drug level monitoring: Secondary | ICD-10-CM | POA: Diagnosis not present

## 2023-05-16 DIAGNOSIS — L570 Actinic keratosis: Secondary | ICD-10-CM | POA: Diagnosis not present

## 2023-05-16 LAB — LAB REPORT - SCANNED
A1c: 5.9
Albumin, Urine POC: 33.9
Creatinine, POC: 200.7 mg/dL
Microalb Creat Ratio: 17

## 2023-05-17 ENCOUNTER — Encounter: Payer: Self-pay | Admitting: Internal Medicine

## 2023-05-17 ENCOUNTER — Encounter: Payer: Self-pay | Admitting: Student

## 2023-05-17 ENCOUNTER — Ambulatory Visit: Payer: Medicare Other | Attending: Student | Admitting: Pulmonary Disease

## 2023-05-17 VITALS — BP 130/80 | HR 56 | Ht 65.0 in | Wt 181.4 lb

## 2023-05-17 DIAGNOSIS — I471 Supraventricular tachycardia, unspecified: Secondary | ICD-10-CM | POA: Diagnosis not present

## 2023-05-17 DIAGNOSIS — I4719 Other supraventricular tachycardia: Secondary | ICD-10-CM

## 2023-05-17 NOTE — Patient Instructions (Signed)
Medication Instructions:  Your physician recommends that you continue on your current medications as directed. Please refer to the Current Medication list given to you today.  *If you need a refill on your cardiac medications before your next appointment, please call your pharmacy*  Lab Work: None ordered If you have labs (blood work) drawn today and your tests are completely normal, you will receive your results only by: MyChart Message (if you have MyChart) OR A paper copy in the mail If you have any lab test that is abnormal or we need to change your treatment, we will call you to review the results.  Follow-Up: At [facility], you and your health needs are our priority.  As part of our continuing mission to provide you with exceptional heart care, we have created designated Provider Care Teams.  These Care Teams include your primary Cardiologist (physician) and Advanced Practice Providers (APPs -  Physician Assistants and Nurse Practitioners) who all work together to provide you with the care you need, when you need it.  Your next appointment:   6 month(s)  Provider:   Cameron Lambert, MD  

## 2023-07-10 ENCOUNTER — Other Ambulatory Visit: Payer: Self-pay | Admitting: Cardiology

## 2023-08-02 DIAGNOSIS — E1165 Type 2 diabetes mellitus with hyperglycemia: Secondary | ICD-10-CM | POA: Diagnosis not present

## 2023-08-02 DIAGNOSIS — M810 Age-related osteoporosis without current pathological fracture: Secondary | ICD-10-CM | POA: Diagnosis not present

## 2023-10-18 ENCOUNTER — Other Ambulatory Visit: Payer: Self-pay | Admitting: Cardiology

## 2023-11-14 DIAGNOSIS — E1165 Type 2 diabetes mellitus with hyperglycemia: Secondary | ICD-10-CM | POA: Diagnosis not present

## 2023-11-17 DIAGNOSIS — L570 Actinic keratosis: Secondary | ICD-10-CM | POA: Diagnosis not present

## 2023-11-17 DIAGNOSIS — Z5181 Encounter for therapeutic drug level monitoring: Secondary | ICD-10-CM | POA: Diagnosis not present

## 2023-11-17 DIAGNOSIS — L578 Other skin changes due to chronic exposure to nonionizing radiation: Secondary | ICD-10-CM | POA: Diagnosis not present

## 2023-11-17 DIAGNOSIS — L821 Other seborrheic keratosis: Secondary | ICD-10-CM | POA: Diagnosis not present

## 2023-11-17 DIAGNOSIS — L409 Psoriasis, unspecified: Secondary | ICD-10-CM | POA: Diagnosis not present

## 2023-11-17 DIAGNOSIS — D225 Melanocytic nevi of trunk: Secondary | ICD-10-CM | POA: Diagnosis not present

## 2023-11-21 ENCOUNTER — Ambulatory Visit: Payer: Medicare Other | Admitting: Cardiology

## 2023-12-17 NOTE — Progress Notes (Signed)
  Electrophysiology Office Follow up Visit Note:    Date:  12/18/2023   ID:  Sonya Steele, DOB 08/12/1951, MRN [id number]  PCP:  Larnell Hamilton, MD  [facility] Cardiologist:  None  [facility] Electrophysiologist:  OLE ONEIDA HOLTS, MD    Interval History:     Sonya Steele is a 73 y.o. female who presents for a follow up visit.   She last saw Brandi 05/17/2023 for SVT. She takes Flecainide . Previously had EP study and AT was mapped close to the HPS. She also takes Toprol . She presents for routine follow up. She has been doing well.  She did have a hip replacement after she twisted her leg wrong and fractured her hip.  Osteoporosis was diagnosed and she has been started on Fosamax.  She reminds me that her prior arrhythmia came shortly after the COVID vaccines and steroid medication use.  She was also diagnosed with diabetes around that time.  These of all stabilized and she is wondering whether or not she could come off the flecainide .       Past medical, surgical, social and family history were reviewed.  ROS:   Please see the history of present illness.    All other systems reviewed and are negative.  EKGs/Labs/Other Studies Reviewed:    The following studies were reviewed today:  05/17/2023 ECG - PR 190, QRS duration 98. Sinus.        Physical Exam:    VS:  BP 120/84 (BP Location: Left Arm, Patient Position: Sitting, Cuff Size: Large)   Pulse 74   Ht 5' 5 (1.651 m)   Wt 185 lb (83.9 kg)   SpO2 97%   BMI 30.79 kg/m     Wt Readings from Last 3 Encounters:  12/18/23 185 lb (83.9 kg)  05/17/23 181 lb 6.4 oz (82.3 kg)  11/18/22 189 lb (85.7 kg)     GEN: no distress CARD: RRR, No MRG RESP: No IWOB. CTAB.      ASSESSMENT:    1. Supraventricular tachycardia (HCC)   2. Encounter for long-term (current) use of high-risk medication    PLAN:    In order of problems listed above:  #SVT #High rism med monitoring - flecainide  Doing well on  flecainide  and toprol .  She is dated and trialing off the flecainide . We will stop flecainide  today.  ECG today shows stable PR and QRS intervals.  She will continue with her metoprolol .  Follow up 6 mo with APP.   Signed, Ole Holts, MD, [facility], [facility] 12/18/2023 10:17 AM    Electrophysiology [facility]

## 2023-12-18 ENCOUNTER — Encounter: Payer: Self-pay | Admitting: Cardiology

## 2023-12-18 ENCOUNTER — Ambulatory Visit: Payer: Medicare Other | Attending: Cardiology | Admitting: Cardiology

## 2023-12-18 VITALS — BP 120/84 | HR 74 | Ht 65.0 in | Wt 185.0 lb

## 2023-12-18 DIAGNOSIS — I471 Supraventricular tachycardia, unspecified: Secondary | ICD-10-CM | POA: Diagnosis not present

## 2023-12-18 DIAGNOSIS — Z79899 Other long term (current) drug therapy: Secondary | ICD-10-CM | POA: Diagnosis not present

## 2023-12-18 NOTE — Patient Instructions (Signed)
Medication Instructions:  Your physician has recommended you make the following change in your medication:  1) STOP taking flecainide   *If you need a refill on your cardiac medications before your next appointment, please call your pharmacy*  Follow-Up: At [facility], you and your health needs are our priority.  As part of our continuing mission to provide you with exceptional heart care, we have created designated Provider Care Teams.  These Care Teams include your primary Cardiologist (physician) and Advanced Practice Providers (APPs -  Physician Assistants and Nurse Practitioners) who all work together to provide you with the care you need, when you need it.  Your next appointment:   6 months  Provider:   You will see one of the following Advanced Practice Providers on your designated Care Team:   Francis Dowse, Charlott Holler [address] [place], [place] Sherie Don, NP Canary Brim, NP

## 2023-12-27 ENCOUNTER — Telehealth: Payer: Self-pay | Admitting: Cardiology

## 2023-12-27 ENCOUNTER — Other Ambulatory Visit: Payer: Self-pay | Admitting: Cardiology

## 2023-12-27 DIAGNOSIS — I4719 Other supraventricular tachycardia: Secondary | ICD-10-CM

## 2023-12-27 DIAGNOSIS — I471 Supraventricular tachycardia, unspecified: Secondary | ICD-10-CM

## 2023-12-27 NOTE — Telephone Encounter (Signed)
Patient c/o Palpitations:  STAT if patient reporting lightheadedness, shortness of breath, or chest pain  How long have you had palpitations/irregular HR/ Afib? Are you having the symptoms now? Monday night per Apple Watch had 10 minutes of tachycardia. Not having right now  Are you currently experiencing lightheadedness, SOB or CP? no  Do you have a history of afib (atrial fibrillation) or irregular heart rhythm? SVT  Have you checked your BP or HR? (document readings if available): 45-122 HR  Are you experiencing any other symptoms? no

## 2023-12-27 NOTE — Telephone Encounter (Signed)
Spoke with the patient who was recently taken off of flecainide. She states that she has noticed her heart rate increasing at rest several times per day since then. She states that she is usually able to use vagal maneuvers and episodes subside. She does not have any symptoms during episodes other than feeling her heart racing. She states that she recently got an Apple Watch on Sunday to monitor. She states that she got an alert that Monday night her heart rate was greater than 120 for more than 10 minutes. She was asleep during this episode. She is unsure about going back on flecainide or changing other medications and would like to know what Dr. Lalla Brothers recommends.

## 2023-12-28 ENCOUNTER — Ambulatory Visit: Payer: Medicare Other | Attending: Cardiology

## 2023-12-28 DIAGNOSIS — I471 Supraventricular tachycardia, unspecified: Secondary | ICD-10-CM

## 2023-12-28 DIAGNOSIS — I4719 Other supraventricular tachycardia: Secondary | ICD-10-CM

## 2023-12-28 NOTE — Telephone Encounter (Signed)
Spoke with the patient and advised on recommendations from Dr. Lalla Brothers. Heart monitor has been ordered and appointment scheduled.

## 2023-12-28 NOTE — Progress Notes (Unsigned)
Enrolled patient for a 7 day Zio XT monitor to be mailed to patients home.  

## 2024-01-01 ENCOUNTER — Telehealth: Payer: Self-pay | Admitting: Cardiology

## 2024-01-01 DIAGNOSIS — I4719 Other supraventricular tachycardia: Secondary | ICD-10-CM | POA: Diagnosis not present

## 2024-01-01 DIAGNOSIS — I471 Supraventricular tachycardia, unspecified: Secondary | ICD-10-CM

## 2024-01-01 NOTE — Telephone Encounter (Signed)
  1. Is this related to a heart monitor you are wearing?  (If the patient says no, please ask     if they are caling about ICD/pacemaker.) no not yet  2. What is your issue?? Pt wants to know if see can come by and get a monitor or when will she receive it in the mail. Please advise   Please route to covering RN/CMA/RMA for results. Route to monitor technicians or your monitor tech representative for your site for any technical concerns

## 2024-01-01 NOTE — Telephone Encounter (Signed)
Spoke with the patient and advised that her heart monitor has been sent out. She should be receiving it in the mail soon. She states that she had a bad episode of palpitations on Friday. She has been monitoring with her apple watch and it has just shown up as tachycardia. No episodes of A Fib.

## 2024-01-03 NOTE — Telephone Encounter (Signed)
Patient states that she had another episode of tachycardia today. She states that she has been wearing the heart monitor since Monday evening. She would like to know if she can go ahead and restart flecainide or take anything else in the meantime before her scheduled follow up with Otilio Saber on 2/17.  Will send to Dr. Lalla Brothers for recommendations.

## 2024-01-03 NOTE — Telephone Encounter (Signed)
Patient is following up. She states she was expecting a call back from Dr. Lovena Neighbours nurse and she hasn't heard back yet.

## 2024-01-05 MED ORDER — FLECAINIDE ACETATE 50 MG PO TABS: 50.0000 mg | ORAL_TABLET | Freq: Two times a day (BID) | ORAL | 3 refills | Status: AC

## 2024-01-05 NOTE — Telephone Encounter (Signed)
Per Dr. Lalla Brothers patient can restart flecainide. Patient has been made aware.

## 2024-01-05 NOTE — Telephone Encounter (Signed)
Patient walked in and states she did not receive a call back about her tachycardia. She states she is not sleeping well because if all the alerts on her Kardia mobile. Her hr has ranged from 115 to 135. She states the Lourena Simmonds mobile is not showing the episodes when it has reached 165 because it didn't last more than 10 mins. She feels the metoprolol is not enough.

## 2024-01-05 NOTE — Addendum Note (Signed)
Addended by: Frutoso Schatz on: 01/05/2024 01:15 PM   Modules accepted: Orders

## 2024-01-15 DIAGNOSIS — M25552 Pain in left hip: Secondary | ICD-10-CM | POA: Diagnosis not present

## 2024-01-15 DIAGNOSIS — I471 Supraventricular tachycardia, unspecified: Secondary | ICD-10-CM | POA: Diagnosis not present

## 2024-01-15 DIAGNOSIS — I4719 Other supraventricular tachycardia: Secondary | ICD-10-CM | POA: Diagnosis not present

## 2024-01-21 NOTE — Progress Notes (Unsigned)
  Electrophysiology Office Note:   Date:  01/22/2024  ID:  Sonya Steele, Sonya Steele 15-Feb-1951, MRN [id number]  Primary Cardiologist: None Electrophysiologist: Lanier Prude, MD      History of Present Illness:   Sonya Steele is a 73 y.o. female with h/o SVT and AT seen today for routine electrophysiology followup.   Since last being seen in our clinic the patient reports doing well. She had recurrent symptoms within 2 days of stopping flecainide. Now back on flecainide with stable EKG and symptoms are again well controlled. She has mild palpitations and "fluttering" when she has SVT. Otherwise,  she denies chest pain, dyspnea, PND, orthopnea, nausea, vomiting, dizziness, syncope, edema, weight gain, or early satiety.   Review of systems complete and found to be negative unless listed in HPI.   EP Information / Studies Reviewed:    EKG is ordered today. Personal review as below.  EKG Interpretation Date/Time:  Monday January 22 2024 09:22:05 EST Ventricular Rate:  67 PR Interval:  190 QRS Duration:  92 QT Interval:  412 QTC Calculation: 435 R Axis:   -31  Text Interpretation: Normal sinus rhythm Left axis deviation Low voltage QRS Cannot rule out Anterior infarct , age undetermined When compared with ECG of 18-Dec-2023 10:07, No significant change was found Confirmed by Maxine Glenn [phone]) on 01/22/2024 9:30:40 AM    Arrhythmia/Device History EPS 09/2020 > atrial tachycardia difficult to map given difficulties inducing the arrythmia and its brief salvos in the lab. Area of earliest activation mapped to the area surrounding the His Bundle. Procedure stopped, & flecainide initiated.  ECHO 07/27/2020 > LVEF 50-55%, no RWMA, G1DD, RVSP normal,  Monitor 12/2023 - 327 SVT episodes, longest 72 minutes  Physical Exam:   VS:  BP 124/72 (BP Location: Left Arm, Patient Position: Sitting, Cuff Size: Large)   Pulse 70   Resp 16   Ht 5\' 5"  (1.651 m)   Wt 185 lb (83.9 kg)   SpO2 97%    BMI 30.79 kg/m    Wt Readings from Last 3 Encounters:  01/22/24 185 lb (83.9 kg)  12/18/23 185 lb (83.9 kg)  05/17/23 181 lb 6.4 oz (82.3 kg)     GEN: No acute distress NECK: No JVD; No carotid bruits CARDIAC: Regular rate and rhythm, no murmurs, rubs, gallops RESPIRATORY:  Clear to auscultation without rales, wheezing or rhonchi  ABDOMEN: Soft, non-tender, non-distended EXTREMITIES:  No edema; No deformity   ASSESSMENT AND PLAN:    SVT High risk medication monitoring Monitor showed very frequent SVT and AT back off of flecainide Continue flecainide 50 mg BID. Previously had severe constipation on higher doses.  Circuit is very near her intrinsic conduction system, so she is a poor candidate for ablation and not currently indicated for pacing.  Continue toprol 25 mg daily.     Follow up with EP APP in 6 months  Signed, Graciella Freer, PA-C

## 2024-01-22 ENCOUNTER — Encounter: Payer: Self-pay | Admitting: Student

## 2024-01-22 ENCOUNTER — Ambulatory Visit: Payer: Medicare Other | Attending: Student | Admitting: Student

## 2024-01-22 VITALS — BP 124/72 | HR 70 | Resp 16 | Ht 65.0 in | Wt 185.0 lb

## 2024-01-22 DIAGNOSIS — I4719 Other supraventricular tachycardia: Secondary | ICD-10-CM

## 2024-01-22 DIAGNOSIS — I471 Supraventricular tachycardia, unspecified: Secondary | ICD-10-CM

## 2024-01-22 NOTE — Patient Instructions (Signed)
Medication Instructions:  Your physician recommends that you continue on your current medications as directed. Please refer to the Current Medication list given to you today.  *If you need a refill on your cardiac medications before your next appointment, please call your pharmacy*  Lab Work: None ordered If you have labs (blood work) drawn today and your tests are completely normal, you will receive your results only by: MyChart Message (if you have MyChart) OR A paper copy in the mail If you have any lab test that is abnormal or we need to change your treatment, we will call you to review the results.  Follow-Up: At [facility], you and your health needs are our priority.  As part of our continuing mission to provide you with exceptional heart care, we have created designated Provider Care Teams.  These Care Teams include your primary Cardiologist (physician) and Advanced Practice Providers (APPs -  Physician Assistants and Nurse Practitioners) who all work together to provide you with the care you need, when you need it.  Your next appointment:   6 month(s)  Provider:   Steffanie Dunn, MD or [place]" [place], [place]

## 2024-03-06 DIAGNOSIS — I4719 Other supraventricular tachycardia: Secondary | ICD-10-CM | POA: Diagnosis not present

## 2024-05-21 DIAGNOSIS — E1165 Type 2 diabetes mellitus with hyperglycemia: Secondary | ICD-10-CM | POA: Diagnosis not present

## 2024-05-21 DIAGNOSIS — E039 Hypothyroidism, unspecified: Secondary | ICD-10-CM | POA: Diagnosis not present

## 2024-05-21 DIAGNOSIS — M810 Age-related osteoporosis without current pathological fracture: Secondary | ICD-10-CM | POA: Diagnosis not present

## 2024-05-21 DIAGNOSIS — E785 Hyperlipidemia, unspecified: Secondary | ICD-10-CM | POA: Diagnosis not present

## 2024-05-28 DIAGNOSIS — E1165 Type 2 diabetes mellitus with hyperglycemia: Secondary | ICD-10-CM | POA: Diagnosis not present

## 2024-05-28 DIAGNOSIS — Z0000 Encounter for general adult medical examination without abnormal findings: Secondary | ICD-10-CM | POA: Diagnosis not present

## 2024-05-28 DIAGNOSIS — Z1331 Encounter for screening for depression: Secondary | ICD-10-CM | POA: Diagnosis not present

## 2024-05-28 DIAGNOSIS — Z1339 Encounter for screening examination for other mental health and behavioral disorders: Secondary | ICD-10-CM | POA: Diagnosis not present

## 2024-05-28 DIAGNOSIS — R82998 Other abnormal findings in urine: Secondary | ICD-10-CM | POA: Diagnosis not present

## 2024-06-05 DIAGNOSIS — I499 Cardiac arrhythmia, unspecified: Secondary | ICD-10-CM | POA: Diagnosis not present

## 2024-06-05 DIAGNOSIS — I4719 Other supraventricular tachycardia: Secondary | ICD-10-CM | POA: Diagnosis not present

## 2024-06-12 DIAGNOSIS — I4719 Other supraventricular tachycardia: Secondary | ICD-10-CM | POA: Diagnosis not present

## 2024-07-02 DIAGNOSIS — L97921 Non-pressure chronic ulcer of unspecified part of left lower leg limited to breakdown of skin: Secondary | ICD-10-CM | POA: Diagnosis not present

## 2024-07-02 DIAGNOSIS — L0889 Other specified local infections of the skin and subcutaneous tissue: Secondary | ICD-10-CM | POA: Diagnosis not present

## 2024-07-18 DIAGNOSIS — D485 Neoplasm of uncertain behavior of skin: Secondary | ICD-10-CM | POA: Diagnosis not present

## 2024-07-18 DIAGNOSIS — L0889 Other specified local infections of the skin and subcutaneous tissue: Secondary | ICD-10-CM | POA: Diagnosis not present

## 2024-07-18 DIAGNOSIS — L929 Granulomatous disorder of the skin and subcutaneous tissue, unspecified: Secondary | ICD-10-CM | POA: Diagnosis not present

## 2024-07-18 DIAGNOSIS — L97921 Non-pressure chronic ulcer of unspecified part of left lower leg limited to breakdown of skin: Secondary | ICD-10-CM | POA: Diagnosis not present

## 2024-07-31 DIAGNOSIS — B958 Unspecified staphylococcus as the cause of diseases classified elsewhere: Secondary | ICD-10-CM | POA: Diagnosis not present

## 2024-07-31 DIAGNOSIS — L97921 Non-pressure chronic ulcer of unspecified part of left lower leg limited to breakdown of skin: Secondary | ICD-10-CM | POA: Diagnosis not present

## 2024-08-08 DIAGNOSIS — R002 Palpitations: Secondary | ICD-10-CM | POA: Diagnosis not present

## 2024-08-08 DIAGNOSIS — I4719 Other supraventricular tachycardia: Secondary | ICD-10-CM | POA: Diagnosis not present

## 2024-08-14 DIAGNOSIS — B958 Unspecified staphylococcus as the cause of diseases classified elsewhere: Secondary | ICD-10-CM | POA: Diagnosis not present

## 2024-08-14 DIAGNOSIS — L97921 Non-pressure chronic ulcer of unspecified part of left lower leg limited to breakdown of skin: Secondary | ICD-10-CM | POA: Diagnosis not present

## 2024-08-28 DIAGNOSIS — B958 Unspecified staphylococcus as the cause of diseases classified elsewhere: Secondary | ICD-10-CM | POA: Diagnosis not present

## 2024-08-28 DIAGNOSIS — L97921 Non-pressure chronic ulcer of unspecified part of left lower leg limited to breakdown of skin: Secondary | ICD-10-CM | POA: Diagnosis not present

## 2024-10-09 DIAGNOSIS — M810 Age-related osteoporosis without current pathological fracture: Secondary | ICD-10-CM | POA: Diagnosis not present

## 2024-10-16 DIAGNOSIS — Z872 Personal history of diseases of the skin and subcutaneous tissue: Secondary | ICD-10-CM | POA: Diagnosis not present

## 2024-10-16 DIAGNOSIS — L819 Disorder of pigmentation, unspecified: Secondary | ICD-10-CM | POA: Diagnosis not present

## 2024-11-20 ENCOUNTER — Other Ambulatory Visit: Payer: Self-pay | Admitting: Cardiology
